# Patient Record
Sex: Female | Born: 1960
Health system: Southern US, Community
[De-identification: ages and names within clinical notes are randomized; demographics above are authoritative.]

## PROBLEM LIST (undated history)

## (undated) DIAGNOSIS — F329 Major depressive disorder, single episode, unspecified: Secondary | ICD-10-CM

## (undated) DIAGNOSIS — R0609 Other forms of dyspnea: Secondary | ICD-10-CM

## (undated) DIAGNOSIS — E8801 Alpha-1-antitrypsin deficiency: Secondary | ICD-10-CM

## (undated) DIAGNOSIS — R0600 Dyspnea, unspecified: Secondary | ICD-10-CM

## (undated) DIAGNOSIS — I10 Essential (primary) hypertension: Secondary | ICD-10-CM

## (undated) DIAGNOSIS — R002 Palpitations: Secondary | ICD-10-CM

## (undated) DIAGNOSIS — I517 Cardiomegaly: Secondary | ICD-10-CM

## (undated) DIAGNOSIS — F32A Depression, unspecified: Secondary | ICD-10-CM

## (undated) HISTORY — DX: Major depressive disorder, single episode, unspecified: F32.9

## (undated) HISTORY — DX: Essential (primary) hypertension: I10

## (undated) HISTORY — PX: COSMETIC SURGERY: SHX468

## (undated) HISTORY — DX: Depression, unspecified: F32.A

## (undated) HISTORY — DX: Dyspnea, unspecified: R06.00

## (undated) HISTORY — DX: Other forms of dyspnea: R06.09

## (undated) HISTORY — PX: APPENDECTOMY: SHX54

## (undated) HISTORY — DX: Alpha-1-antitrypsin deficiency: E88.01

## (undated) HISTORY — DX: Palpitations: R00.2

## (undated) HISTORY — PX: BREAST ENHANCEMENT SURGERY: SHX7

## (undated) HISTORY — PX: CHOLECYSTECTOMY: SHX55

---

## 1998-11-03 ENCOUNTER — Ambulatory Visit (HOSPITAL_COMMUNITY): Admission: RE | Admit: 1998-11-03 | Discharge: 1998-11-04 | Payer: Self-pay | Admitting: General Surgery

## 1998-11-03 ENCOUNTER — Encounter: Payer: Self-pay | Admitting: General Surgery

## 1999-01-06 ENCOUNTER — Other Ambulatory Visit: Admission: RE | Admit: 1999-01-06 | Discharge: 1999-01-06 | Payer: Self-pay | Admitting: Obstetrics and Gynecology

## 2000-02-23 ENCOUNTER — Other Ambulatory Visit: Admission: RE | Admit: 2000-02-23 | Discharge: 2000-02-23 | Payer: Self-pay | Admitting: *Deleted

## 2000-12-19 ENCOUNTER — Encounter (INDEPENDENT_AMBULATORY_CARE_PROVIDER_SITE_OTHER): Payer: Self-pay

## 2000-12-19 ENCOUNTER — Ambulatory Visit (HOSPITAL_COMMUNITY): Admission: RE | Admit: 2000-12-19 | Discharge: 2000-12-19 | Payer: Self-pay | Admitting: Obstetrics and Gynecology

## 2001-04-14 ENCOUNTER — Emergency Department (HOSPITAL_COMMUNITY): Admission: EM | Admit: 2001-04-14 | Discharge: 2001-04-15 | Payer: Self-pay | Admitting: Emergency Medicine

## 2001-04-15 ENCOUNTER — Encounter: Payer: Self-pay | Admitting: Emergency Medicine

## 2001-07-08 ENCOUNTER — Other Ambulatory Visit: Admission: RE | Admit: 2001-07-08 | Discharge: 2001-07-08 | Payer: Self-pay | Admitting: Obstetrics and Gynecology

## 2001-12-03 ENCOUNTER — Emergency Department (HOSPITAL_COMMUNITY): Admission: EM | Admit: 2001-12-03 | Discharge: 2001-12-03 | Payer: Self-pay

## 2002-02-25 ENCOUNTER — Inpatient Hospital Stay (HOSPITAL_COMMUNITY): Admission: EM | Admit: 2002-02-25 | Discharge: 2002-02-27 | Payer: Self-pay | Admitting: Psychiatry

## 2002-05-18 ENCOUNTER — Inpatient Hospital Stay (HOSPITAL_COMMUNITY): Admission: EM | Admit: 2002-05-18 | Discharge: 2002-05-22 | Payer: Self-pay | Admitting: Psychiatry

## 2002-05-27 ENCOUNTER — Other Ambulatory Visit (HOSPITAL_COMMUNITY): Admission: RE | Admit: 2002-05-27 | Discharge: 2002-05-29 | Payer: Self-pay | Admitting: Psychiatry

## 2002-06-08 ENCOUNTER — Encounter: Admission: RE | Admit: 2002-06-08 | Discharge: 2002-06-08 | Payer: Self-pay | Admitting: Psychiatry

## 2002-06-18 ENCOUNTER — Encounter: Admission: RE | Admit: 2002-06-18 | Discharge: 2002-06-18 | Payer: Self-pay | Admitting: Psychiatry

## 2002-06-23 ENCOUNTER — Encounter: Admission: RE | Admit: 2002-06-23 | Discharge: 2002-06-23 | Payer: Self-pay | Admitting: Psychiatry

## 2002-06-29 ENCOUNTER — Encounter: Admission: RE | Admit: 2002-06-29 | Discharge: 2002-06-29 | Payer: Self-pay | Admitting: Psychiatry

## 2002-07-24 ENCOUNTER — Ambulatory Visit: Admission: RE | Admit: 2002-07-24 | Discharge: 2002-07-24 | Payer: Self-pay | Admitting: Psychiatry

## 2002-08-20 ENCOUNTER — Encounter: Admission: RE | Admit: 2002-08-20 | Discharge: 2002-08-20 | Payer: Self-pay | Admitting: Professional Counselor

## 2002-08-26 ENCOUNTER — Encounter: Admission: RE | Admit: 2002-08-26 | Discharge: 2002-08-26 | Payer: Self-pay | Admitting: Psychiatry

## 2002-09-03 ENCOUNTER — Encounter: Admission: RE | Admit: 2002-09-03 | Discharge: 2002-09-03 | Payer: Self-pay | Admitting: Professional Counselor

## 2002-09-23 ENCOUNTER — Other Ambulatory Visit: Admission: RE | Admit: 2002-09-23 | Discharge: 2002-09-23 | Payer: Self-pay | Admitting: Obstetrics and Gynecology

## 2002-10-19 ENCOUNTER — Encounter: Admission: RE | Admit: 2002-10-19 | Discharge: 2002-10-19 | Payer: Self-pay | Admitting: Psychiatry

## 2002-10-28 ENCOUNTER — Encounter: Admission: RE | Admit: 2002-10-28 | Discharge: 2002-10-28 | Payer: Self-pay | Admitting: Psychiatry

## 2002-12-28 ENCOUNTER — Encounter: Admission: RE | Admit: 2002-12-28 | Discharge: 2002-12-28 | Payer: Self-pay | Admitting: Psychiatry

## 2003-01-04 ENCOUNTER — Encounter: Admission: RE | Admit: 2003-01-04 | Discharge: 2003-01-04 | Payer: Self-pay | Admitting: *Deleted

## 2003-01-19 ENCOUNTER — Encounter: Admission: RE | Admit: 2003-01-19 | Discharge: 2003-01-19 | Payer: Self-pay | Admitting: Psychiatry

## 2003-04-14 ENCOUNTER — Encounter: Admission: RE | Admit: 2003-04-14 | Discharge: 2003-04-14 | Payer: Self-pay | Admitting: Psychiatry

## 2003-04-28 ENCOUNTER — Encounter: Admission: RE | Admit: 2003-04-28 | Discharge: 2003-04-28 | Payer: Self-pay | Admitting: Psychiatry

## 2003-09-06 ENCOUNTER — Encounter: Admission: RE | Admit: 2003-09-06 | Discharge: 2003-09-06 | Payer: Self-pay | Admitting: Psychiatry

## 2005-09-30 ENCOUNTER — Emergency Department (HOSPITAL_COMMUNITY): Admission: AD | Admit: 2005-09-30 | Discharge: 2005-09-30 | Payer: Self-pay | Admitting: Family Medicine

## 2007-05-02 ENCOUNTER — Encounter: Admission: RE | Admit: 2007-05-02 | Discharge: 2007-05-02 | Payer: Self-pay | Admitting: Gastroenterology

## 2008-05-20 ENCOUNTER — Encounter: Admission: RE | Admit: 2008-05-20 | Discharge: 2008-05-20 | Payer: Self-pay | Admitting: Obstetrics and Gynecology

## 2008-06-08 ENCOUNTER — Encounter: Admission: RE | Admit: 2008-06-08 | Discharge: 2008-06-08 | Payer: Self-pay | Admitting: Obstetrics and Gynecology

## 2010-09-03 ENCOUNTER — Encounter: Payer: Self-pay | Admitting: Obstetrics and Gynecology
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
## 2010-12-29 NOTE — Discharge Summary (Signed)
NAME:  [name], [name]                       ACCOUNT NO.:  [id number]   MEDICAL RECORD NO.:  [id number]                   PATIENT TYPE:  IPS   LOCATION:  0503                                 [facility]ACILITY:  [facility]   PHYSICIAN:  Geoffery Lyons, MD                     DATE OF BIRTH:  1961/08/10   DATE OF ADMISSION:  02/24/2002  DATE OF DISCHARGE:  02/27/2002                                 DISCHARGE SUMMARY   CHIEF COMPLAINT AND PRESENTING ILLNESS:  This was the first admission to  [facility]  for this 50 year old divorced white female,  voluntarily admitted.  History of depression with an intentional overdose of  Flexeril, Klonopin.  She was home alone, wanting to go to sleep, unaware  that she did write a note to her family and her sons.  She went to her  primary doc for medication changes due to increased depression.  Wellbutrin  was increased to 400 mg per day.  Stressors include working night shift with  erratic sleep.  He did get to sleep all day the day his brother came to  check, then took more pills when he was there.   PAST PSYCHIATRIC HISTORY:  No outpatient treatment.   ALCOHOL AND DRUG HISTORY:  Four beers, denies any problem.   PAST MEDICAL HISTORY:  Noncontributory.   MEDICATIONS:  Wellbutrin 200 twice a day, Paxil 40 mg in the morning,  although she has been off for a couple of days.   PHYSICAL EXAMINATION:  Performed, failed to show any acute findings.   LABORATORY DATA:  CBC was within normal limits.  Thyroid profile was within  normal limits.  Urine pregnancy test was negative.   ADMITTING DIAGNOSES:   AXIS I:  Major depression.   AXIS II:  No diagnosis.   AXIS III:  No diagnosis.   AXIS IV:  Moderate.   AXIS V:  Global assessment of function upon admission 25-30, highest global  assessment of function in past year 70.   COURSE IN HOSPITAL:  She was admitted and started on intensive individual  and group psychotherapy.  She was placed back  on Wellbutrin.  The Paxil was  eventually decreased and discontinued and we started Lexapro as she felt  that the Paxil was not really working for her anymore.  Started trying to  understand what happened to her.  She felt that she was not being compliant  with the Wellbutrin and when she saw her primary physician it was increased  to 400 mg per day, a change that had something to do with the way she was  feeling.  She had been on the Paxil 40, which she felt was not working.  We  had a family session July 17 with sister and brother.  Denied any suicidal  intent.  In individual and group therapy she worked on Pharmacologist and  stress  management.  July 18, she was in full contact with reality, mood  improved, affect brighter.  She denied any suicidal or homicidal ideas.  The  session with the brother had gone well, said she felt supported and feels  willing and motivated to pursue further stabilization through the IOP  program.   DISCHARGE DIAGNOSES:   AXIS I:  Major depression   AXIS II:  No diagnosis.   AXIS III:  No diagnosis.   AXIS IV:  Moderate.   AXIS V:  Global assessment of function upon discharge 60.   DISCHARGE MEDICATIONS:  1. Wellbutrin SR 150 twice a day.  2. Lexapro 10 mg daily.  3. Ambien 10 mg at bedtime for sleep.  4. Ativan 0.5 mg every 6 hours as needed for anxiety.   DISPOSITION:  Follow up at IOP and Dr. Aleatha Borer.                                               Geoffery Lyons, MD    IL/MEDQ  D:  04/01/2002  T:  04/03/2002  Job:  [id number] [id number]

## 2010-12-29 NOTE — Op Note (Signed)
[facility]  Patient:    [name], [name]                    MRN: [id number] Proc. Date: 12/19/00 Adm. Date:  [id number] Attending:  Silverio Lay A                           Operative Report  PREOPERATIVE DIAGNOSIS:       Submucosal fibroid.  POSTOPERATIVE DIAGNOSIS:      Submucosal fibroid.  OPERATION:                    Hysteroscopy with resection of fibroid or                               myomectomy.  SURGEON:                      Silverio Lay, M.D.  ANESTHESIA:                   General.  ESTIMATED BLOOD LOSS:         Minimal.  DESCRIPTION OF PROCEDURE:     After being informed of the planned procedure with possible complications including bleeding, infection, uterine perforation, need for laparoscopy or laparotomy with possibility of bowel injury, the patient was fully consented and taken to OR #2.  She was given general anesthesia with laryngeal mask.  She was placed in the lithotomy position, prepped and draped in a sterile fashion.  The bladder was emptied with a Foley catheter.  GYN exam revealed a slightly retroverted uterus, normal shape and size, two normal adnexa.  A weighted speculum was inserted; anterior of the cervix was grasped with the tenaculum forceps, and uterus was sounded at 8 cm.  We proceed with systematic dilatation of the cervix with Hegar dilators at #33 in an easy fashion.  Hysteroscope was then inserted in the uterine cavity.  Perfusion was started is sorbitol 3% at a maximum pressure of 90 mmHg which allowed [place] good visualization of the uterine cavity.  We could easily see the right tubal ostium which was normal.  The left side was obstructed by a left anterolateral wall fibroid with a large base measuring approximately 2.5 cm.  Endometrium was thin and appeared normal.  We then proceed with systematic resection of this fibroid in multiple passes using a double loop resectoscope.  We were able to remove  the whole fibroid up to the uterine wall and to finally visualize the left tubal ostia which was also normal.  We then reduced intrauterine pressure to 50 mmHg, and two small bleeding sites were coagulated.  Hemostasis after that was felt to be adequate.  Instruments were then removed.  Instrument and sponge count is complete x 2.  Estimated blood loss is minimal.  Fluid deficit is -150 cc. The procedure was very well tolerated by the patient who was taken to the recovery room in a well and stable condition. DD:  12/19/00 TD:  12/19/00 Job: [id number]

## 2011-11-26 ENCOUNTER — Ambulatory Visit (INDEPENDENT_AMBULATORY_CARE_PROVIDER_SITE_OTHER): Payer: BC Managed Care – PPO | Admitting: Internal Medicine

## 2011-11-26 VITALS — BP 147/81 | HR 93 | Temp 97.4°F | Resp 16 | Ht 65.75 in | Wt 124.6 lb

## 2011-11-26 DIAGNOSIS — S40862A Insect bite (nonvenomous) of left upper arm, initial encounter: Secondary | ICD-10-CM

## 2011-11-26 DIAGNOSIS — IMO0001 Reserved for inherently not codable concepts without codable children: Secondary | ICD-10-CM

## 2011-11-26 DIAGNOSIS — F17200 Nicotine dependence, unspecified, uncomplicated: Secondary | ICD-10-CM

## 2011-11-26 MED ORDER — MUPIROCIN CALCIUM 2 % EX CREA: TOPICAL_CREAM | Freq: Two times a day (BID) | CUTANEOUS | Status: AC

## 2011-11-26 NOTE — Progress Notes (Signed)
  Subjective:    Patient ID: [name], female    DOB: Mar 30, 1961, 51 y.o.   MRN: [id number]  HPI  [name] is a 51 year old WF here with complaints of a insect bite to her left wrist on Saturday. It has come up to a small white papule on her arm surrounded by erythema.  Itches slightly, she denies any fever, chills, myalgias, headache, or other systemic symptoms..  She is generally very healthy, on no prescription meds, sees [name] every year for her GYN check.  Trying to reduce her cigarettes, "smokes rarely".   Review of Systems  All other systems reviewed and are negative.       Objective:   Physical Exam  Vitals reviewed. Constitutional: She is oriented to person, place, and time. She appears well-developed and well-nourished.  HENT:  Head: Normocephalic.  Eyes: Conjunctivae are normal.  Cardiovascular: Normal rate, regular rhythm and normal heart sounds.   Pulmonary/Chest: Effort normal and breath sounds normal.  Musculoskeletal: She exhibits no tenderness.  Neurological: She is alert and oriented to person, place, and time.  Skin: There is erythema.       2 mm white papule on left wrist with slight surrounding erythema, no adenopathy in her left arm appreciated.          Assessment & Plan:  Insect bite with superficial abcess/cellulitis only.  Suggest warm compresses, wash gently to unroof lesion at home then apply Bactroban cream to site twice daily until skin heals.  Pt agrees with this plan.  Encouraged to stop smoking!

## 2012-05-20 ENCOUNTER — Ambulatory Visit (INDEPENDENT_AMBULATORY_CARE_PROVIDER_SITE_OTHER): Payer: BC Managed Care – PPO | Admitting: Physician Assistant

## 2012-05-20 ENCOUNTER — Ambulatory Visit: Payer: BC Managed Care – PPO

## 2012-05-20 VITALS — BP 90/70 | HR 69 | Temp 98.3°F | Resp 17 | Ht 65.75 in | Wt 129.0 lb

## 2012-05-20 DIAGNOSIS — J40 Bronchitis, not specified as acute or chronic: Secondary | ICD-10-CM

## 2012-05-20 DIAGNOSIS — R05 Cough: Secondary | ICD-10-CM

## 2012-05-20 LAB — POCT CBC
Granulocyte percent: 53.8 %G (ref 37–80)
HCT, POC: 44.4 % (ref 37.7–47.9)
Hemoglobin: 14.1 g/dL (ref 12.2–16.2)
Lymph, poc: 2.3 (ref 0.6–3.4)
MCV: 89.6 fL (ref 80–97)
POC Granulocyte: 3.5 (ref 2–6.9)
POC LYMPH PERCENT: 35 %L (ref 10–50)
RDW, POC: 14.1 %

## 2012-05-20 MED ORDER — BENZONATATE 200 MG PO CAPS: 200.0000 mg | ORAL_CAPSULE | Freq: Two times a day (BID) | ORAL | Status: DC | PRN

## 2012-05-20 MED ORDER — AZITHROMYCIN 250 MG PO TABS: ORAL_TABLET | ORAL | Status: DC

## 2012-05-20 NOTE — Progress Notes (Signed)
Patient ID: [name] MRN: [id number], DOB: 10-17-1960, 51 y.o. Date of Encounter: 05/20/2012, 11:29 AM  Primary Physician: No primary provider on file.  Chief Complaint:  Chief Complaint  Patient presents with  . Back Pain  . Chills    x3days  . Sore Throat  . Cough    productive at night/greenish yellow    HPI: 51 y.o. year old female presents with a three day history of mild nasal congestion, post nasal drip, sore throat, myalgias, and cough. No sinus pressure. Subjective fever and chills. Cough is mildly productive of green/yellow sputum and seems to be worse at night keeping her awake. No wheezing, shortness of breath, or dyspnea. Normal hearing. Has tried OTC cold preps without success. No GI complaints. Appetite normal. Had pneumonia the previous year, and this feels similar. She does occasionally smoke tobacco when she drinks alcohol.   No sick contacts, recent antibiotics, or recent travels.   No leg trauma, sedentary periods, or h/o cancer.  No past medical history on file.   Home Meds: Prior to Admission medications   Not on File    Allergies:  Allergies  Allergen Reactions  . Codeine Nausea And Vomiting  . Penicillins Rash  . Sulfa Drugs Cross Reactors Rash    History   Social History  . Marital Status: Divorced    Spouse Name: N/A    Number of Children: N/A  . Years of Education: N/A   Occupational History  . Not on file.   Social History Main Topics  . Smoking status: Former Smoker    Quit date: 05/20/2004  . Smokeless tobacco: Not on file  . Alcohol Use: Not on file  . Drug Use: Not on file  . Sexually Active: Not on file   Other Topics Concern  . Not on file   Social History Narrative  . No narrative on file     Review of Systems: Constitutional: negative for night sweats or weight changes Cardiovascular: negative for chest pain or palpitations Respiratory: negative for hemoptysis, wheezing, or shortness of breath Abdominal:  negative for abdominal pain, nausea, vomiting or diarrhea Dermatological: negative for rash Neurologic: negative for headache   Physical Exam: Blood pressure 90/70, pulse 69, temperature 98.3 F (36.8 C), temperature source Oral, resp. rate 17, height 5' 5.75" (1.67 m), weight 129 lb (58.514 kg), SpO2 97.00%., Body mass index is 20.98 kg/(m^2). General: Well developed, well nourished, in no acute distress. Head: Normocephalic, atraumatic, eyes without discharge, sclera non-icteric, nares are congested. Bilateral auditory canals clear, TM's are without perforation, pearly grey with reflective cone of light bilaterally. No sinus TTP. Oral cavity moist, dentition normal. Posterior pharynx with post nasal drip and mild erythema. No peritonsillar abscess or tonsillar exudate. Neck: Supple. No thyromegaly. Full ROM. No lymphadenopathy. Lungs: Coarse breath sounds left lower lobe posteriorly without wheezes, rales, or rhonchi. Breathing is unlabored.  Heart: RRR with S1 S2. No murmurs, rubs, or gallops appreciated. Msk:  Strength and tone normal for age. Extremities: No clubbing or cyanosis. No edema. Neuro: Alert and oriented X 3. Moves all extremities spontaneously. CNII-XII grossly in tact. Psych:  Responds to questions appropriately with a normal affect.   Labs: Results for orders placed in visit on 05/20/12  POCT CBC      Component Value Range   WBC 6.5  4.6 - 10.2 K/uL   Lymph, poc 2.3  0.6 - 3.4   POC LYMPH PERCENT 35.0  10 - 50 %L   MID (  cbc) 0.7  0 - 0.9   POC MID % 11.2  0 - 12 %M   POC Granulocyte 3.5  2 - 6.9   Granulocyte percent 53.8  37 - 80 %G   RBC 4.96  4.04 - 5.48 M/uL   Hemoglobin 14.1  12.2 - 16.2 g/dL   HCT, POC 40.9  81.1 - 47.9 %   MCV 89.6  80 - 97 fL   MCH, POC 28.4  27 - 31.2 pg   MCHC 31.8  31.8 - 35.4 g/dL   RDW, POC 91.4     Platelet Count, POC 240  142 - 424 K/uL   MPV 9.4  0 - 99.8 fL    [facility] reading (PRIMARY) by  Dr. Perrin Maltese. Bilateral breast  augmentation Hyperinflation  No infiltrate   ASSESSMENT AND PLAN:  51 y.o. year old female with bronchitis and cough. -Azithromycin 250 MG #6 2 po first day then 1 po next 4 days no RF -Tessalon Perles 200 mg 1 po tid prn cough #40 no RF -Reaction to "pain medication" is hives -Declines inhaler -Advised patient that she must quit smoking tobacco -Advised her that her chest xray shows hyperinflation and explained what this means -Mucinex -Tylenol/Motrin prn -Rest/fluids -Out of work today -RTC precautions -RTC 3-5 days if no improvement  Signed, Eula Listen, PA-C 05/20/2012 11:29 AM

## 2013-04-24 ENCOUNTER — Other Ambulatory Visit: Payer: Self-pay

## 2013-04-24 DIAGNOSIS — Z9882 Breast implant status: Secondary | ICD-10-CM

## 2013-04-24 DIAGNOSIS — Z1231 Encounter for screening mammogram for malignant neoplasm of breast: Secondary | ICD-10-CM

## 2013-04-28 ENCOUNTER — Ambulatory Visit
Admission: RE | Admit: 2013-04-28 | Discharge: 2013-04-28 | Disposition: A | Discharge status: Home / Self Care | Payer: BC Managed Care – PPO | Source: Ambulatory Visit

## 2013-04-28 DIAGNOSIS — Z1231 Encounter for screening mammogram for malignant neoplasm of breast: Secondary | ICD-10-CM

## 2013-04-28 DIAGNOSIS — Z9882 Breast implant status: Secondary | ICD-10-CM

## 2013-08-26 ENCOUNTER — Ambulatory Visit (INDEPENDENT_AMBULATORY_CARE_PROVIDER_SITE_OTHER): Payer: BC Managed Care – PPO | Admitting: Family Medicine

## 2013-08-26 ENCOUNTER — Ambulatory Visit: Payer: BC Managed Care – PPO

## 2013-08-26 VITALS — BP 120/70 | HR 81 | Temp 98.0°F | Resp 16 | Ht 65.0 in | Wt 125.0 lb

## 2013-08-26 DIAGNOSIS — M545 Low back pain, unspecified: Secondary | ICD-10-CM

## 2013-08-26 DIAGNOSIS — M25552 Pain in left hip: Secondary | ICD-10-CM

## 2013-08-26 DIAGNOSIS — S300XXA Contusion of lower back and pelvis, initial encounter: Secondary | ICD-10-CM

## 2013-08-26 DIAGNOSIS — S7002XA Contusion of left hip, initial encounter: Secondary | ICD-10-CM

## 2013-08-26 DIAGNOSIS — S20229A Contusion of unspecified back wall of thorax, initial encounter: Secondary | ICD-10-CM

## 2013-08-26 DIAGNOSIS — S7000XA Contusion of unspecified hip, initial encounter: Secondary | ICD-10-CM

## 2013-08-26 MED ORDER — METHOCARBAMOL 500 MG PO TABS: 500.0000 mg | ORAL_TABLET | Freq: Four times a day (QID) | ORAL | Status: DC | PRN

## 2013-08-26 NOTE — Progress Notes (Addendum)
Subjective:    Patient ID: [name], female    DOB: September 07, 1960, 53 y.o.   MRN: [id number]  HPI This chart was scribed for Kristi Smith-MD, by Ladona Ridgel [place] Inthavong, Scribe. This patient was seen in room 3 and the patient's care was started at 10:50 AM.  HPI Comments: [name] is a 53 y.o. female who presents to the [facility] complaining of a fall this AM b/c she tripped while walking off of her porch landing on left elbow, right wrist and left lower back w/superficial abrasion to her left lower back. She tried using a heating pad and took x3 ibuprofen w/out any relief from her back pain. She denies hitting her head. She reports pain radiates down left leg; she denies numbness/weakness of either legs. She denies any urinary incontinence or difficulty urinating.  Job is very physically demanding; does a lot of lifting and walking at work.   She has no PCP   There are no active problems to display for this patient.  Past Surgical History  Procedure Laterality Date  . Appendectomy    . Cosmetic surgery    . Cesarean section     Family History  Problem Relation Age of Onset  . Heart disease Mother   . Stroke Mother   . Heart disease Father   . Healthy Sister   . Healthy Brother   . Healthy Brother   . Healthy Sister    History   Social History  . Marital Status: Divorced    Spouse Name: N/A    Number of Children: N/A  . Years of Education: N/A   Occupational History  . Not on file.   Social History Main Topics  . Smoking status: Former Smoker    Quit date: 05/20/2004  . Smokeless tobacco: Not on file  . Alcohol Use: Not on file  . Drug Use: Not on file  . Sexual Activity: Not on file   Other Topics Concern  . Not on file   Social History Narrative  . No narrative on file    Allergies  Allergen Reactions  . Codeine Nausea And Vomiting  . Penicillins Rash  . Sulfa Drugs Cross Reactors Rash     Review of Systems  Constitutional:  Negative for fever and chills.  Respiratory: Negative for cough and shortness of breath.   Cardiovascular: Negative for chest pain.  Gastrointestinal: Negative for abdominal pain.  Genitourinary: Negative for decreased urine volume.  Musculoskeletal: Positive for arthralgias, back pain and myalgias. Negative for gait problem, joint swelling, neck pain and neck stiffness.  Skin: Positive for color change. Negative for wound.  Neurological: Positive for headaches. Negative for dizziness, syncope, facial asymmetry, weakness, light-headedness and numbness.      Objective:   Physical Exam  Nursing note and vitals reviewed. Constitutional: She is oriented to person, place, and time. She appears well-developed and well-nourished. No distress.  HENT:  Head: Normocephalic and atraumatic.  Eyes: Conjunctivae and EOM are normal. Right eye exhibits no discharge. Left eye exhibits no discharge.  Neck: Normal range of motion. Neck supple.  Cardiovascular: Normal rate.   Pulmonary/Chest: Effort normal. No respiratory distress.  Musculoskeletal: Normal range of motion. She exhibits tenderness. She exhibits no edema.       Right shoulder: Normal.       Left shoulder: Normal.       Right elbow: Normal.      Left elbow: Normal.       Right wrist:  Normal.       Left wrist: Normal.       Right hip: Normal.       Right knee: Normal.       Left knee: Normal.       Right ankle: Normal.       Left ankle: Normal.       Cervical back: Normal.       Thoracic back: Normal.       Right foot: Normal.       Left foot: Normal.  No pain w/rotation of her neck Superficial abrasion to superior aspect of gluteal fold  Tenderness to palpation sacral and coccyx region Tenderness to palpation left lumbar paraspinal region Left hip pain w/external rotation but left hip non tender to palpation. Mild tenderness to palpation left iliac crest.   Full ROM cervical spine w/out pain and full ROM w/out pain to her  BUE  Marching intact, hell toe intact Motor 5/5  Neurological: She is alert and oriented to person, place, and time.  SLR positive bilaterally   Skin: Skin is warm and dry.  Psychiatric: She has a normal mood and affect. Thought content normal.   Triage Vitals: BP 120/70  Pulse 81  Temp(Src) 98 F (36.7 C) (Oral)  Resp 16  Ht 5\' 5"  (1.651 m)  Wt 125 lb (56.7 kg)  BMI 20.80 kg/m2  SpO2 99%  DIAGNOSTIC STUDIES: Oxygen Saturation is 99% on room air, normal by my interpretation.    COORDINATION OF CARE: At 1010 AM Discussed treatment plan with patient which includes lumbar spine X-ray and coccyx X-ray. Patient agrees.   [facility] reading (PRIMARY) by  Dr. .  LUMBAR SPINE:  NAD; sacralization of L5.  L HIP: NAD.      Assessment & Plan:  Lower back pain - Plan: DG Lumbar Spine Complete  Left hip pain - Plan: DG Hip Complete Left  Contusion of lower back  Contusion of hip, left  1.  Pain L hip/contusion L hip:  New.  Continue Ibuprofen 600-800mg  tid with food; rx for Robaxin qid PRN.  Continue heat to area bid for 15 minutes; encourage frequent ambulation and stretching.  2.  Low back pain/contusion lower back:  New.  Secondary to fall and trauma.  Continue Ibuprofen 600-800mg  tid PRN; rx for Robaxin provided; continue heat and stretching and frequent ambulation.  Call in two weeks if no improvement.  Avoid heavy lifting when possible yet job very physically demanding which may interfere with improvement.  Meds ordered this encounter  Medications  . methocarbamol (ROBAXIN) 500 MG tablet    Sig: Take 1 tablet (500 mg total) by mouth every 6 (six) hours as needed for muscle spasms.    Dispense:  40 tablet    Refill:  0    I personally performed the services described in this documentation, which was scribed in my presence.  The recorded information has been reviewed and is accurate.  Katrinka Blazing, M.D.  [facility] [address] [place], [place]  [place] [phone] phone [phone] fax

## 2013-08-28 ENCOUNTER — Telehealth: Payer: Self-pay

## 2013-08-28 MED ORDER — TRAMADOL HCL 50 MG PO TABS: 50.0000 mg | ORAL_TABLET | Freq: Four times a day (QID) | ORAL | Status: DC | PRN

## 2013-08-28 NOTE — Telephone Encounter (Signed)
OK to extend work note for 1/17 and 1/18 for return to work on 1/19.  I am happy to call in Tramadol for her for pain that she can take in addition to Ibuprofen and muscle relaxer.  Please call in Tramadol as approved by rx if patient desires.

## 2013-08-28 NOTE — Telephone Encounter (Signed)
Dr. Katrinka Blazing:  Patient saw you Wednesday morning due to a fall on ice.  She is still hurting and doesn't think she can work this weekend and wants you to write a note so she can be out of work this weekend and go back to work on Monday.  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  Her number is [phone].   So much pulling and lifting and it is really bothering her.  She said even her insides are hurting. She has been been taking the muscle relaxers that you prescribed and Ibuprofen and she doesn't feel like it is touching the pain.  Should she take something else or just keep doing what she's doing. Please leave a message on her cell phone.  She is planning on leaving early today due to the pain and would like to know asap if we can get her a note.

## 2013-08-28 NOTE — Telephone Encounter (Signed)
LMOM for pt that extended OOW note ready and that I called in Tramadol Rx to [facility] if she feels that she needs it, she may p/up.

## 2013-08-28 NOTE — Telephone Encounter (Signed)
Is it ok for note? 

## 2013-09-14 ENCOUNTER — Telehealth: Payer: Self-pay

## 2013-09-14 NOTE — Telephone Encounter (Signed)
Pt saw Dr. Katrinka Blazing on 1/14 for back pain. She has tried to take the muscle relaxer but it did not help and the pain medicine makes her sick on her stomach and makes her have trouble breathing. She would like to know if there is anything else that we could call in for her. She was going to look up the name of a medication that she has taken in the past that helped her.  She can be reached at [phone] She uses the [facility]

## 2013-09-16 NOTE — Telephone Encounter (Signed)
Call --- 1.  Most pain medications can cause nausea unfortunately.  2. Does she recall the name of the medication that she could tolerate in the past?  If not, recommend taking Tylenol ES 2 tablets four times daily for pain as needed.  She can also take Ibuprofen with the Tylenol because they have different mechanisms of action.

## 2013-09-16 NOTE — Telephone Encounter (Signed)
Pt says that she has been able to take hydrocodone in the past. She's had back trouble for years but since injuring her back its been worse. States she has already been taking ibuprofen and tylenol and that doesn't touch her pain. 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 Her preference is to try pain meds and give her back a little more time to heal. If this doesn't work then she will RTC.

## 2013-09-16 NOTE — Telephone Encounter (Signed)
LMOM for pt to call back.

## 2014-11-17 ENCOUNTER — Ambulatory Visit (INDEPENDENT_AMBULATORY_CARE_PROVIDER_SITE_OTHER): Payer: BLUE CROSS/BLUE SHIELD | Admitting: Physician Assistant

## 2014-11-17 VITALS — BP 142/94 | HR 79 | Temp 98.3°F | Resp 18 | Ht 65.5 in | Wt 128.0 lb

## 2014-11-17 DIAGNOSIS — R079 Chest pain, unspecified: Secondary | ICD-10-CM

## 2014-11-17 DIAGNOSIS — G44209 Tension-type headache, unspecified, not intractable: Secondary | ICD-10-CM

## 2014-11-17 DIAGNOSIS — M549 Dorsalgia, unspecified: Secondary | ICD-10-CM | POA: Diagnosis not present

## 2014-11-17 DIAGNOSIS — R350 Frequency of micturition: Secondary | ICD-10-CM | POA: Diagnosis not present

## 2014-11-17 DIAGNOSIS — R0609 Other forms of dyspnea: Secondary | ICD-10-CM

## 2014-11-17 LAB — POCT UA - MICROSCOPIC ONLY
BACTERIA, U MICROSCOPIC: NEGATIVE
CASTS, UR, LPF, POC: NEGATIVE
Crystals, Ur, HPF, POC: NEGATIVE
MUCUS UA: NEGATIVE
YEAST UA: NEGATIVE

## 2014-11-17 LAB — POCT URINALYSIS DIPSTICK
Bilirubin, UA: NEGATIVE
Glucose, UA: NEGATIVE
KETONES UA: NEGATIVE
LEUKOCYTES UA: NEGATIVE
NITRITE UA: NEGATIVE
PROTEIN UA: NEGATIVE
Spec Grav, UA: 1.01
UROBILINOGEN UA: 0.2
pH, UA: 7

## 2014-11-17 MED ORDER — CYCLOBENZAPRINE HCL 5 MG PO TABS: 5.0000 mg | ORAL_TABLET | Freq: Three times a day (TID) | ORAL | Status: DC | PRN

## 2014-11-17 NOTE — Patient Instructions (Signed)
I think your headache is most likely a tension type headache or from some congestion from allergies. Please take a daily antihistamine like Claritin once day for allergy season.  Please come back to see [place] if your headache persists.  I think your back pain is most likely due to strain, spasm in mid back. Please take the flexeril at night. Please bend at the knees when lifting, think about a massage, and apply heat when you can. Please come back to see [place] if these measures don't help for further workup, possibly imaging.  Your urine sample was normal today.  Your chest pressure and shortness of breath is concerning. Please let us know if you change your mind about a cardiology referral. Please go to the ER asap if the chest pain or shortness of breath worsens or persists.

## 2014-11-17 NOTE — Progress Notes (Signed)
Subjective:    Patient ID: [name], female    DOB: Jun 25, 1961, 54 y.o.   MRN: [id number]  Chief Complaint  Patient presents with  . Back Pain    mid back, worse the past few mths   . Headache    over a mth now worse when waking up    There are no active problems to display for this patient.  Prior to Admission medications   Not on File   Medications, allergies, past medical history, surgical history, family history, social history and problem list reviewed and updated.  HPI  54 yof with no significant pmh presents with back pain, urinary freq, headache, chest pain, shortness of breath.   Back pain - Hx bilateral mid back pain past few months. Has been ongoing and fairly constant, approx 8/10. Denies trauma. 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 Denies radiation of pain. Denies sciatica. Denies bowel/bladder dysfx (though she has had mild increased urinary freq past 2 wks) and denies perianal los. No hematuria, dysuria.   HA - Intermittent past 2 months. Bitemporal. Lasts for hrs at a time. Sometimes worst in the morning. Not worst of life. No assoc fevers. No neck pain. No assoc vision changes. No assoc syncope. No extremity weakness, numbness. Denies itchy watery eyes, head congestion, otalgia.   DOE - mentions DOE past few wks. Getting sob with working in yard, going up stairs. No sob at rest. This is new for her. Denies orthopnea, LE edema, PND. Denies assoc chest pain, denies exertional cp.   Chest pain - no exertional cp but does sometimes feel pressure in chest when lifting heavy things. Resolves within secs of activity. No radiation. Rated 5/10. SSCP. No assoc palps, presyncope, syncope. Not sob with cp episodes. Has strong fam hx cad.   States she is not concerned about the cp and sob, only worried about the back pain which is severe and the HA.   Review of Systems See HPI.     Objective:   Physical Exam  Constitutional: She is  oriented to person, place, and time. She appears well-developed and well-nourished.  Non-toxic appearance. She does not have a sickly appearance. She does not appear ill. No distress.  BP 142/94 mmHg  Pulse 79  Temp(Src) 98.3 F (36.8 C) (Oral)  Resp 18  Ht 5' 5.5" (1.664 m)  Wt 128 lb (58.06 kg)  BMI 20.97 kg/m2  SpO2 100%   HENT:  Right Ear: Tympanic membrane normal.  Left Ear: Tympanic membrane normal.  Nose: No mucosal edema or rhinorrhea. Right sinus exhibits no maxillary sinus tenderness and no frontal sinus tenderness. Left sinus exhibits no maxillary sinus tenderness and no frontal sinus tenderness.  Mouth/Throat: Uvula is midline, oropharynx is clear and moist and mucous membranes are normal.  Eyes: Conjunctivae and EOM are normal. Pupils are equal, round, and reactive to light.  Neck: No JVD present. No Brudzinski's sign noted.  Cardiovascular: Normal rate, regular rhythm and normal heart sounds.  Exam reveals no gallop.   No murmur heard. Pulmonary/Chest: Effort normal and breath sounds normal. No tachypnea. She has no decreased breath sounds. She has no wheezes. She has no rhonchi. She has no rales.  Musculoskeletal:       Cervical back: She exhibits no bony tenderness.       Thoracic back: She exhibits tenderness, pain and spasm. She exhibits normal range of motion and no bony tenderness.  Lumbar back: She exhibits no tenderness, no bony tenderness and no spasm.       Back:  TTP across mid back. No spinal ttp. Mild spasm noted   Lymphadenopathy:       Head (right side): No submental, no submandibular and no tonsillar adenopathy present.       Head (left side): No submental, no submandibular and no tonsillar adenopathy present.    She has no cervical adenopathy.  Neurological: She is alert and oriented to person, place, and time. She has normal strength. No cranial nerve deficit or sensory deficit. She displays a negative Romberg sign. Coordination and gait normal.    Psychiatric: She has a normal mood and affect. Her speech is normal and behavior is normal.   Results for orders placed or performed in visit on 11/17/14  POCT urinalysis dipstick  Result Value Ref Range   Color, UA yellow    Clarity, UA clear    Glucose, UA neg    Bilirubin, UA neg    Ketones, UA neg    Spec Grav, UA 1.010    Blood, UA trace-lysed    pH, UA 7.0    Protein, UA neg    Urobilinogen, UA 0.2    Nitrite, UA neg    Leukocytes, UA Negative   POCT UA - Microscopic Only  Result Value Ref Range   WBC, Ur, HPF, POC 0-1    RBC, urine, microscopic 0-1    Bacteria, U Microscopic neg    Mucus, UA neg    Epithelial cells, urine per micros 0-2    Crystals, Ur, HPF, POC neg    Casts, Ur, LPF, POC neg    Yeast, UA neg       Assessment & Plan:   54 yof with no significant pmh presents with back pain, urinary freq, headache, chest pain, shortness of breath.   Increased frequency of urination - Plan: POCT urinalysis dipstick, POCT UA - Microscopic Only --no increased thirst --ua normal --no dehydration to suggest as cause of below headache --rtc if persists  Mid back pain - Plan: POCT urinalysis dipstick, POCT UA - Microscopic Only --no bony ttp, no xray indicated, no trauma --no cva tenderness --ttp, spasm paraspinals mid back bilaterally --heat, massage, flexeril, light rom, bend at knees with lifting --rtc for imaging if persists  Tension-type headache, not intractable, unspecified chronicity pattern --doubt sah with chronicity, doubt meningitis with no fevers, nuchal rigidity --suspect tension as bitemporal, lasts for hrs, comes on most days --denies snoring, waking sob --tylenol prn, hydration --rtc if continue --could be secondary to allergic rhinitis though no signs of this on exam today, rec daily antihistamine, flonase through allergy season  Dyspnea on exertion Chest pain, unspecified chest pain type --DOE concerning for cardiac cause  --Fam hx cad  otherwise no known risk factors --Discussed with pt concern for cardiac etiology, pt declines EKG, chest xray, stress test, cardiology referral --instructed to go to ER asap if cp worsens or does not resolve on own, or if shortness of breath worsens  Donnajean Lopes, PA-C Physician Assistant-Certified [facility]  11/17/2014 1:13 PM

## 2014-11-25 ENCOUNTER — Telehealth: Payer: Self-pay

## 2014-11-25 NOTE — Telephone Encounter (Signed)
Pt saw Donnajean Lopes, PA at 11/17/2014 12:33 PM for Mid back pain and given a muscle relaxer, pt states it is not working for her, and she would like to know if there is something else she can be called in. Please advise

## 2014-11-26 NOTE — Telephone Encounter (Signed)
As per the note from 11/17/14 she should return to care for possible imaging and further workup since the pain has persisted. She can always take tylenol as needed for the pain if the flexeril did not help.

## 2014-11-27 NOTE — Telephone Encounter (Signed)
Pt.notified

## 2015-09-16 ENCOUNTER — Emergency Department (HOSPITAL_COMMUNITY): Payer: BLUE CROSS/BLUE SHIELD

## 2015-09-16 ENCOUNTER — Encounter (HOSPITAL_COMMUNITY): Payer: Self-pay | Admitting: Family Medicine

## 2015-09-16 ENCOUNTER — Emergency Department (HOSPITAL_COMMUNITY)
Admission: EM | Admit: 2015-09-16 | Discharge: 2015-09-16 | Disposition: A | Discharge status: Home / Self Care | Payer: BLUE CROSS/BLUE SHIELD | Attending: Emergency Medicine | Admitting: Emergency Medicine

## 2015-09-16 DIAGNOSIS — R112 Nausea with vomiting, unspecified: Secondary | ICD-10-CM | POA: Diagnosis not present

## 2015-09-16 DIAGNOSIS — R630 Anorexia: Secondary | ICD-10-CM | POA: Insufficient documentation

## 2015-09-16 DIAGNOSIS — Z79899 Other long term (current) drug therapy: Secondary | ICD-10-CM | POA: Insufficient documentation

## 2015-09-16 DIAGNOSIS — F329 Major depressive disorder, single episode, unspecified: Secondary | ICD-10-CM | POA: Insufficient documentation

## 2015-09-16 DIAGNOSIS — Z880 Allergy status to penicillin: Secondary | ICD-10-CM | POA: Diagnosis not present

## 2015-09-16 DIAGNOSIS — R002 Palpitations: Secondary | ICD-10-CM | POA: Diagnosis not present

## 2015-09-16 DIAGNOSIS — Z87891 Personal history of nicotine dependence: Secondary | ICD-10-CM | POA: Insufficient documentation

## 2015-09-16 DIAGNOSIS — R519 Headache, unspecified: Secondary | ICD-10-CM

## 2015-09-16 DIAGNOSIS — I10 Essential (primary) hypertension: Secondary | ICD-10-CM | POA: Insufficient documentation

## 2015-09-16 DIAGNOSIS — R51 Headache: Secondary | ICD-10-CM | POA: Diagnosis present

## 2015-09-16 LAB — I-STAT TROPONIN, ED
TROPONIN I, POC: 0 ng/mL (ref 0.00–0.08)
Troponin i, poc: 0 ng/mL (ref 0.00–0.08)

## 2015-09-16 LAB — BASIC METABOLIC PANEL
ANION GAP: 10 (ref 5–15)
BUN: 7 mg/dL (ref 6–20)
CALCIUM: 9.5 mg/dL (ref 8.9–10.3)
CO2: 30 mmol/L (ref 22–32)
Chloride: 101 mmol/L (ref 101–111)
Creatinine, Ser: 0.8 mg/dL (ref 0.44–1.00)
GFR calc non Af Amer: >60 mL/min (ref >60)
Glucose, Bld: 101 mg/dL — ABNORMAL HIGH (ref 65–99)
Potassium: 3.4 mmol/L — ABNORMAL LOW (ref 3.5–5.1)
Sodium: 141 mmol/L (ref 135–145)

## 2015-09-16 LAB — CBC
HCT: 40.4 % (ref 36.0–46.0)
HEMOGLOBIN: 13.8 g/dL (ref 12.0–15.0)
MCH: 30.2 pg (ref 26.0–34.0)
MCHC: 34.2 g/dL (ref 30.0–36.0)
MCV: 88.4 fL (ref 78.0–100.0)
Platelets: 236 10*3/uL (ref 150–400)
RBC: 4.57 MIL/uL (ref 3.87–5.11)
RDW: 13.5 % (ref 11.5–15.5)
WBC: 5.4 10*3/uL (ref 4.0–10.5)

## 2015-09-16 LAB — TSH: TSH: 3.222 u[IU]/mL (ref 0.350–4.500)

## 2015-09-16 LAB — URINALYSIS, ROUTINE W REFLEX MICROSCOPIC
BILIRUBIN URINE: NEGATIVE
Glucose, UA: NEGATIVE mg/dL
HGB URINE DIPSTICK: NEGATIVE
KETONES UR: NEGATIVE mg/dL
LEUKOCYTES UA: NEGATIVE
NITRITE: NEGATIVE
Protein, ur: NEGATIVE mg/dL
SPECIFIC GRAVITY, URINE: 1.005 (ref 1.005–1.030)
pH: 7 (ref 5.0–8.0)

## 2015-09-16 LAB — D-DIMER, QUANTITATIVE (NOT AT ARMC): D DIMER QUANT: 0.3 ug{FEU}/mL (ref 0.00–0.50)

## 2015-09-16 MED ORDER — DEXAMETHASONE SODIUM PHOSPHATE 10 MG/ML IJ SOLN: 10.0000 mg | Freq: Once | INTRAMUSCULAR | Status: DC

## 2015-09-16 MED ORDER — DIPHENHYDRAMINE HCL 50 MG/ML IJ SOLN
50.0000 mg | Freq: Once | INTRAMUSCULAR | Status: DC
  Filled 2015-09-16: qty 1

## 2015-09-16 MED ORDER — DIPHENHYDRAMINE HCL 50 MG/ML IJ SOLN
25.0000 mg | Freq: Once | INTRAMUSCULAR | Status: AC
  Administered 2015-09-16: 25 mg via INTRAVENOUS

## 2015-09-16 MED ORDER — HYDROCHLOROTHIAZIDE 25 MG PO TABS: 25.0000 mg | ORAL_TABLET | Freq: Every day | ORAL | Status: DC

## 2015-09-16 MED ORDER — SODIUM CHLORIDE 0.9 % IV BOLUS (SEPSIS)
500.0000 mL | Freq: Once | INTRAVENOUS | Status: AC
  Administered 2015-09-16: 500 mL via INTRAVENOUS

## 2015-09-16 MED ORDER — LABETALOL HCL 5 MG/ML IV SOLN
10.0000 mg | Freq: Once | INTRAVENOUS | Status: AC
  Administered 2015-09-16: 10 mg via INTRAVENOUS
  Filled 2015-09-16: qty 4

## 2015-09-16 MED ORDER — METOCLOPRAMIDE HCL 5 MG/ML IJ SOLN
10.0000 mg | Freq: Once | INTRAMUSCULAR | Status: AC
  Administered 2015-09-16: 10 mg via INTRAVENOUS
  Filled 2015-09-16: qty 2

## 2015-09-16 MED ORDER — CLONIDINE HCL 0.2 MG PO TABS
0.2000 mg | ORAL_TABLET | Freq: Once | ORAL | Status: AC
  Administered 2015-09-16: 0.2 mg via ORAL
  Filled 2015-09-16: qty 1

## 2015-09-16 NOTE — ED Notes (Signed)
Pt here for headache, heart pounding, weakness and nausea. sts some vomiting. sts she never has HA. sts she has been tired for 5 months.

## 2015-09-16 NOTE — Discharge Instructions (Signed)
If you were given medicines take as directed.  If you are on coumadin or contraceptives realize their levels and effectiveness is altered by many different medicines.  If you have any reaction (rash, tongues swelling, other) to the medicines stop taking and see a physician.  Call your doctor Monday morning.  Very important to see a doctor to get your blood pressure controlled and further evaluation!! Start taking blood pressure medicines. If your blood pressure was elevated in the ER make sure you follow up for management with a primary doctor or return for chest pain, shortness of breath or stroke symptoms.  Please follow up as directed and return to the ER or see a physician for new or worsening symptoms.  Thank you. Filed Vitals:   09/16/15 1845 09/16/15 1855 09/16/15 1900 09/16/15 1916  BP: 189/114 170/112 183/107 172/95  Pulse: 97 86 84 83  Temp:      TempSrc:      Resp: 18 16 13 15   SpO2: 97% 97% 98% 98%

## 2015-09-16 NOTE — ED Provider Notes (Signed)
CSN: [id number]     Arrival date & time 09/16/15  1410 History   First MD Initiated Contact with Patient 09/16/15 1657     Chief Complaint  Patient presents with  . Headache  . Nausea  . Palpitations     (Consider location/radiation/quality/duration/timing/severity/associated sxs/prior Treatment) HPI Comments: 55 year old female with no significant medical history, no regular doctor however has seen [facility] presents with intermittent gradual onset worsening headache him a palpitations, intermittent nonradiating  and nausea worsening for the past 4 months. Patient has had the headaches for months now. Patient is not on any blood pressure medications. Past smoker however occasionally smokes. No cardiac history blood clot history, no recent surgery. Mild shortness of breath. No classic blood clot risk factors.  Patient is a 55 y.o. female presenting with headaches and palpitations. The history is provided by the patient.  Headache Associated symptoms: nausea and vomiting   Associated symptoms: no abdominal pain, no back pain, no congestion, no fever, no neck pain and no neck stiffness   Palpitations Associated symptoms: nausea, shortness of breath and vomiting   Associated symptoms: no back pain and no chest pain     Past Medical History  Diagnosis Date  . Depression    Past Surgical History  Procedure Laterality Date  . Appendectomy    . Cosmetic surgery    . Cesarean section     Family History  Problem Relation Age of Onset  . Heart disease Mother   . Stroke Mother   . Heart disease Father   . Healthy Sister   . Healthy Brother   . Healthy Brother   . Healthy Sister    Social History  Substance Use Topics  . Smoking status: Former Smoker    Quit date: 05/20/2004  . Smokeless tobacco: None  . Alcohol Use: None   OB History    No data available     Review of Systems  Constitutional: Positive for appetite change. Negative for fever and chills.  HENT:  Negative for congestion.   Eyes: Negative for visual disturbance.  Respiratory: Positive for shortness of breath.   Cardiovascular: Positive for palpitations. Negative for chest pain.  Gastrointestinal: Positive for nausea and vomiting. Negative for abdominal pain.  Genitourinary: Negative for dysuria and flank pain.  Musculoskeletal: Negative for back pain, neck pain and neck stiffness.  Skin: Negative for rash.  Neurological: Positive for light-headedness and headaches.      Allergies  Codeine; Penicillins; and Sulfa drugs cross reactors  Home Medications   Prior to Admission medications   Medication Sig Start Date End Date Taking? Authorizing Provider  DULoxetine (CYMBALTA) 60 MG capsule Take 60 mg by mouth daily. 06/30/15  Yes Historical Provider, MD  hydrochlorothiazide (HYDRODIURIL) 25 MG tablet Take 1 tablet (25 mg total) by mouth daily. 09/16/15   Blane Ohara, MD   BP 172/95 mmHg  Pulse 83  Temp(Src) 98.4 F (36.9 C) (Oral)  Resp 15  SpO2 98% Physical Exam  Constitutional: She is oriented to person, place, and time. She appears well-developed and well-nourished.  HENT:  Head: Normocephalic and atraumatic.  Eyes: Right eye exhibits no discharge. Left eye exhibits no discharge.  Neck: Normal range of motion. Neck supple. No tracheal deviation present.  Cardiovascular: Normal rate and regular rhythm.   Pulmonary/Chest: Effort normal and breath sounds normal.  Abdominal: Soft. She exhibits no distension. There is no tenderness. There is no guarding.  Musculoskeletal: She exhibits no edema.  Neurological: She is alert  and oriented to person, place, and time. GCS eye subscore is 4. GCS verbal subscore is 5. GCS motor subscore is 6.  5+ strength in UE and LE with f/e at major joints. Sensation to palpation intact in UE and LE. CNs 2-12 grossly intact.  EOMFI.  PERRL.   Finger nose and coordination intact bilateral.   Visual fields intact to finger testing. No nystagmus    Skin: Skin is warm. No rash noted.  Psychiatric: She has a normal mood and affect.  Nursing note and vitals reviewed.   ED Course  Procedures (including critical care time) Labs Review Labs Reviewed  BASIC METABOLIC PANEL - Abnormal; Notable for the following:    Potassium 3.4 (*)    Glucose, Bld 101 (*)    All other components within normal limits  CBC  URINALYSIS, ROUTINE W REFLEX MICROSCOPIC (NOT AT [facility])  D-DIMER, QUANTITATIVE (NOT AT [facility])  TSH  I-STAT TROPOININ, ED  Rosezena Sensor, ED    Imaging Review Dg Chest 2 View  09/16/2015  CLINICAL DATA:  Palpitation and headache for about 4 weeks, weakness EXAM: CHEST  2 VIEW COMPARISON:  05/20/2012 FINDINGS: Cardiomediastinal silhouette is stable. No acute infiltrate or pleural effusion. No pulmonary edema. Bony thorax is unremarkable. Bilateral breast implants again noted. IMPRESSION: No active cardiopulmonary disease. Electronically Signed   By: Natasha Mead M.D.   On: 09/16/2015 14:55   Ct Head Wo Contrast  09/16/2015  CLINICAL DATA:  Headache and nausea with heart palpitations today Elevated blood pressure EXAM: CT HEAD WITHOUT CONTRAST TECHNIQUE: Contiguous axial images were obtained from the base of the skull through the vertex without intravenous contrast. COMPARISON:  None. FINDINGS: No acute intracranial hemorrhage. No focal mass lesion. No CT evidence of acute infarction. No midline shift or mass effect. No hydrocephalus. Basilar cisterns are patent. Paranasal sinuses and  mastoid air cells are clear. IMPRESSION: Normal head CT. Electronically Signed   By: Genevive Bi M.D.   On: 09/16/2015 18:35   I have personally reviewed and evaluated these images and lab results as part of my medical decision-making.   EKG Interpretation   Date/Time:  Friday September 16 2015 14:28:53 EST Ventricular Rate:  102 PR Interval:  132 QRS Duration: 90 QT Interval:  360 QTC Calculation: 469 R Axis:   103 Text Interpretation:  Sinus  tachycardia Right atrial enlargement Rightward  axis Borderline ECG Confirmed by Kymoni Monday  MD, Eshawn Coor ([id number]) on 09/16/2015  5:04:37 PM      MDM   Final diagnoses:  Headache, unspecified headache type  Heart palpitations  Essential hypertension   Patient presents with worsening headache and multiple other symptoms for the past few months. Patient blood pressure elevated in the ER, patient says she is no history of elevated blood pressure in these headaches are different than normal, gradual onset. Discussed concern for headaches different than usual and significant elevated blood pressure which may be related to all her symptoms. Plan for migraine cocktail and blood pressure medication. CT head pending. No chest pain on exam. Patient low risk cardiac however patient does not follow-up closely outpatient.  Blood pressure improved in the ER. Discussed importance of blood pressure management. Discussed observation the hospital versus close outpatient follow-up, patient does not want to come the hospital and says she will follow the primary doctor Monday and will start taking medicines.  The patients results and plan were reviewed and discussed.   Any x-rays performed were independently reviewed by myself.   Differential diagnosis  were considered with the presenting HPI.  Medications  metoCLOPramide (REGLAN) injection 10 mg (10 mg Intravenous Given 09/16/15 1759)  sodium chloride 0.9 % bolus 500 mL (0 mLs Intravenous Stopped 09/16/15 1843)  diphenhydrAMINE (BENADRYL) injection 25 mg (25 mg Intravenous Given 09/16/15 1802)  labetalol (NORMODYNE,TRANDATE) injection 10 mg (10 mg Intravenous Given 09/16/15 1847)  cloNIDine (CATAPRES) tablet 0.2 mg (0.2 mg Oral Given 09/16/15 1917)    Filed Vitals:   09/16/15 1845 09/16/15 1855 09/16/15 1900 09/16/15 1916  BP: 189/114 170/112 183/107 172/95  Pulse: 97 86 84 83  Temp:      TempSrc:      Resp: 18 16 13 15   SpO2: 97% 97% 98% 98%    Final diagnoses:   Headache, unspecified headache type  Heart palpitations  Essential hypertension    Admission/ observation were discussed with the admitting physician, patient and/or family and they are comfortable with the plan.     , MD 09/17/15 [phone]

## 2015-09-20 ENCOUNTER — Other Ambulatory Visit: Payer: Self-pay | Admitting: Family Medicine

## 2015-09-20 ENCOUNTER — Ambulatory Visit (HOSPITAL_COMMUNITY): Payer: BLUE CROSS/BLUE SHIELD | Attending: Cardiovascular Disease

## 2015-09-20 ENCOUNTER — Other Ambulatory Visit: Payer: Self-pay

## 2015-09-20 DIAGNOSIS — I10 Essential (primary) hypertension: Secondary | ICD-10-CM | POA: Insufficient documentation

## 2015-09-20 DIAGNOSIS — Z87891 Personal history of nicotine dependence: Secondary | ICD-10-CM | POA: Diagnosis not present

## 2015-09-20 DIAGNOSIS — R0600 Dyspnea, unspecified: Secondary | ICD-10-CM | POA: Diagnosis present

## 2015-09-20 DIAGNOSIS — I517 Cardiomegaly: Secondary | ICD-10-CM | POA: Diagnosis not present

## 2015-09-20 DIAGNOSIS — Z8249 Family history of ischemic heart disease and other diseases of the circulatory system: Secondary | ICD-10-CM | POA: Insufficient documentation

## 2015-09-26 ENCOUNTER — Other Ambulatory Visit: Payer: Self-pay | Admitting: Family Medicine

## 2015-09-26 DIAGNOSIS — R1032 Left lower quadrant pain: Secondary | ICD-10-CM

## 2015-09-26 DIAGNOSIS — R1031 Right lower quadrant pain: Secondary | ICD-10-CM

## 2015-09-26 DIAGNOSIS — I10 Essential (primary) hypertension: Secondary | ICD-10-CM

## 2015-09-27 ENCOUNTER — Other Ambulatory Visit: Payer: Self-pay | Admitting: Family Medicine

## 2015-09-27 ENCOUNTER — Ambulatory Visit
Admission: RE | Admit: 2015-09-27 | Discharge: 2015-09-27 | Disposition: A | Discharge status: Home / Self Care | Payer: BLUE CROSS/BLUE SHIELD | Source: Ambulatory Visit | Attending: Family Medicine | Admitting: Family Medicine

## 2015-09-27 DIAGNOSIS — R0602 Shortness of breath: Secondary | ICD-10-CM

## 2015-09-27 DIAGNOSIS — R030 Elevated blood-pressure reading, without diagnosis of hypertension: Secondary | ICD-10-CM

## 2015-09-27 DIAGNOSIS — R000 Tachycardia, unspecified: Secondary | ICD-10-CM

## 2015-09-27 DIAGNOSIS — IMO0001 Reserved for inherently not codable concepts without codable children: Secondary | ICD-10-CM

## 2015-09-27 MED ORDER — IOPAMIDOL (ISOVUE-370) INJECTION 76%
100.0000 mL | Freq: Once | INTRAVENOUS | Status: AC | PRN
  Administered 2015-09-27: 100 mL via INTRAVENOUS

## 2015-09-29 ENCOUNTER — Encounter: Payer: Self-pay | Admitting: Cardiovascular Disease

## 2015-09-29 ENCOUNTER — Ambulatory Visit (INDEPENDENT_AMBULATORY_CARE_PROVIDER_SITE_OTHER): Payer: BLUE CROSS/BLUE SHIELD | Admitting: Cardiovascular Disease

## 2015-09-29 VITALS — BP 126/78 | HR 68 | Ht 65.5 in | Wt 124.8 lb

## 2015-09-29 DIAGNOSIS — R079 Chest pain, unspecified: Secondary | ICD-10-CM | POA: Diagnosis not present

## 2015-09-29 DIAGNOSIS — R0600 Dyspnea, unspecified: Secondary | ICD-10-CM

## 2015-09-29 DIAGNOSIS — I10 Essential (primary) hypertension: Secondary | ICD-10-CM

## 2015-09-29 DIAGNOSIS — Z79899 Other long term (current) drug therapy: Secondary | ICD-10-CM

## 2015-09-29 DIAGNOSIS — R0609 Other forms of dyspnea: Secondary | ICD-10-CM

## 2015-09-29 DIAGNOSIS — R002 Palpitations: Secondary | ICD-10-CM | POA: Insufficient documentation

## 2015-09-29 DIAGNOSIS — E7800 Pure hypercholesterolemia, unspecified: Secondary | ICD-10-CM

## 2015-09-29 NOTE — Assessment & Plan Note (Signed)
This finding relates approximately a year of dyspnea on exertion. She is a former tobacco user having smoked 10-12-pack-years and stopped 7 years ago. Recent 2-D echo showed grade 1 diastolic dysfunction with mild LVH. I'm going to get a exercise Myoview to rule out an ischemic etiology.

## 2015-09-29 NOTE — Assessment & Plan Note (Signed)
History palpitations occur on a daily basis. She denies caffeine intake. I'm going to get a 2 week event monitor.

## 2015-09-29 NOTE — Assessment & Plan Note (Signed)
Mrs. Gianino is seen today for evaluation of new-onset hypertension and dyspnea. She has not been hypertensive before. Recent 2-D echo showed mild LVH. She does admit to eating 4-6 pickles today and it chart of eyelids. We talked about the importance of salt reduction. She is on atenolol and chlorthalidone. I'm going to get a renal Doppler was ordered to rule out renal vascular hypertension

## 2015-09-29 NOTE — Progress Notes (Signed)
09/29/2015 [name]   [birth year]  [id number]  Primary Physician Farris Has, MD Primary Cardiologist: Runell Gess MD Roseanne Reno   HPI:  [name] is a 55 year old thin appearing engaged Caucasian female mother of 3 children (2 biologic) and one grandson referred by Dr. Ricci Barker at [facility] for evaluation of new onset hypertension, dyspnea fatigue and palpitations. Her cardiac risk factor profile is notable for 12 years of tobacco abuse having quit 7 years ago. She does drink 2-4 beers a night. Her father had CAD. She said new-onset hypertension last several weeks with ER visit for headache. She had negative head CT and a chest CT that was negative for pulmonary emboli. Coronary calcification were not mentioned. She is complaining of fatigue the last year and increasing dyspnea on exertion. Recent 2-D echo showed normal LV function, mild LVH with grade 1 diastolic dysfunction.   Current Outpatient Prescriptions  Medication Sig Dispense Refill  . atenolol-chlorthalidone (TENORETIC) 50-25 MG tablet Take 1 tablet by mouth daily.     No current facility-administered medications for this visit.    Allergies  Allergen Reactions  . Codeine Nausea And Vomiting  . Penicillins Rash  . Sulfa Drugs Cross Reactors Rash    Social History   Social History  . Marital Status: Divorced    Spouse Name: N/A  . Number of Children: N/A  . Years of Education: N/A   Occupational History  . Not on file.   Social History Main Topics  . Smoking status: Current Some Day Smoker    Last Attempt to Quit: 05/20/2004  . Smokeless tobacco: Not on file     Comment: social smoking  . Alcohol Use: Not on file  . Drug Use: Not on file  . Sexual Activity: Not on file   Other Topics Concern  . Not on file   Social History Narrative     Review of Systems: General: negative for chills, fever, night sweats or weight changes.  Cardiovascular: negative for chest pain, dyspnea on  exertion, edema, orthopnea, palpitations, paroxysmal nocturnal dyspnea or shortness of breath Dermatological: negative for rash Respiratory: negative for cough or wheezing Urologic: negative for hematuria Abdominal: negative for nausea, vomiting, diarrhea, bright red blood per rectum, melena, or hematemesis Neurologic: negative for visual changes, syncope, or dizziness All other systems reviewed and are otherwise negative except as noted above.    Blood pressure 126/78, pulse 68, height 5' 5.5" (1.664 m), weight 124 lb 12.8 oz (56.609 kg).  General appearance: alert and no distress Neck: no adenopathy, no carotid bruit, no JVD, supple, symmetrical, trachea midline and thyroid not enlarged, symmetric, no tenderness/mass/nodules Lungs: clear to auscultation bilaterally Heart: regular rate and rhythm, S1, S2 normal, no murmur, click, rub or gallop Extremities: extremities normal, atraumatic, no cyanosis or edema  EKG not performed today  ASSESSMENT AND PLAN:   Essential hypertension [name] is seen today for evaluation of new-onset hypertension and dyspnea. She has not been hypertensive before. Recent 2-D echo showed mild LVH. She does admit to eating 4-6 pickles today and it chart of eyelids. We talked about the importance of salt reduction. She is on atenolol and chlorthalidone. I'm going to get a renal Doppler was ordered to rule out renal vascular hypertension  Dyspnea on exertion This finding relates approximately a year of dyspnea on exertion. She is a former tobacco user having smoked 10-12-pack-years and stopped 7 years ago. Recent 2-D echo showed grade 1 diastolic dysfunction with mild LVH. I'm  going to get a exercise Myoview to rule out an ischemic etiology.  Palpitations History palpitations occur on a daily basis. She denies caffeine intake. I'm going to get a 2 week event monitor.      Runell Gess MD FACP,FACC,FAHA, [facility] 09/29/2015 2:15 PM

## 2015-09-29 NOTE — Patient Instructions (Signed)
Your physician has requested that you have a renal artery duplex. During this test, an ultrasound is used to evaluate blood flow to the kidneys. Allow one hour for this exam. Do not eat after midnight the day before and avoid carbonated beverages. Take your medications as you usually do.  Your physician has requested that you have en exercise stress myoview. For further information please visit [email or web address] Please follow instruction sheet, as given.  Your physician has recommended that you wear an event monitor. Event monitors are medical devices that record the heart's electrical activity. Doctors most often [place] these monitors to diagnose arrhythmias. Arrhythmias are problems with the speed or rhythm of the heartbeat. The monitor is a small, portable device. You can wear one while you do your normal daily activities. This is usually used to diagnose what is causing palpitations/syncope (passing out).  Your physician recommends that you return for lab work in: FASTING AT [facility]  Your physician recommends that you schedule a follow-up appointment in: 2 MONTHS

## 2015-10-06 ENCOUNTER — Telehealth (HOSPITAL_COMMUNITY): Payer: Self-pay

## 2015-10-06 NOTE — Telephone Encounter (Signed)
Encounter complete. 

## 2015-10-10 ENCOUNTER — Ambulatory Visit
Admission: RE | Admit: 2015-10-10 | Discharge: 2015-10-10 | Disposition: A | Discharge status: Home / Self Care | Payer: BLUE CROSS/BLUE SHIELD | Source: Ambulatory Visit | Attending: Family Medicine | Admitting: Family Medicine

## 2015-10-10 DIAGNOSIS — R1032 Left lower quadrant pain: Secondary | ICD-10-CM

## 2015-10-10 DIAGNOSIS — I10 Essential (primary) hypertension: Secondary | ICD-10-CM

## 2015-10-10 DIAGNOSIS — R1031 Right lower quadrant pain: Secondary | ICD-10-CM

## 2015-10-11 ENCOUNTER — Ambulatory Visit (HOSPITAL_COMMUNITY)
Admission: RE | Admit: 2015-10-11 | Discharge: 2015-10-11 | Disposition: A | Discharge status: Home / Self Care | Payer: BLUE CROSS/BLUE SHIELD | Source: Ambulatory Visit | Attending: Cardiovascular Disease | Admitting: Cardiovascular Disease

## 2015-10-11 ENCOUNTER — Ambulatory Visit (HOSPITAL_COMMUNITY)
Admission: RE | Admit: 2015-10-11 | Discharge: 2015-10-11 | Disposition: A | Discharge status: Home / Self Care | Payer: BLUE CROSS/BLUE SHIELD | Source: Ambulatory Visit | Attending: Urology | Admitting: Urology

## 2015-10-11 DIAGNOSIS — I10 Essential (primary) hypertension: Secondary | ICD-10-CM

## 2015-10-11 DIAGNOSIS — R5383 Other fatigue: Secondary | ICD-10-CM | POA: Diagnosis not present

## 2015-10-11 DIAGNOSIS — R079 Chest pain, unspecified: Secondary | ICD-10-CM | POA: Diagnosis present

## 2015-10-11 DIAGNOSIS — R0609 Other forms of dyspnea: Secondary | ICD-10-CM | POA: Insufficient documentation

## 2015-10-11 DIAGNOSIS — R002 Palpitations: Secondary | ICD-10-CM | POA: Diagnosis not present

## 2015-10-11 DIAGNOSIS — Z8249 Family history of ischemic heart disease and other diseases of the circulatory system: Secondary | ICD-10-CM | POA: Diagnosis not present

## 2015-10-11 DIAGNOSIS — I779 Disorder of arteries and arterioles, unspecified: Secondary | ICD-10-CM | POA: Diagnosis not present

## 2015-10-11 DIAGNOSIS — F17200 Nicotine dependence, unspecified, uncomplicated: Secondary | ICD-10-CM | POA: Insufficient documentation

## 2015-10-11 MED ORDER — TECHNETIUM TC 99M SESTAMIBI GENERIC - CARDIOLITE
10.9000 | Freq: Once | INTRAVENOUS | Status: AC | PRN
  Administered 2015-10-11: 10.9 via INTRAVENOUS

## 2015-10-11 MED ORDER — TECHNETIUM TC 99M SESTAMIBI GENERIC - CARDIOLITE
30.8000 | Freq: Once | INTRAVENOUS | Status: AC | PRN
  Administered 2015-10-11: 30.8 via INTRAVENOUS

## 2015-10-12 LAB — MYOCARDIAL PERFUSION IMAGING
CHL CUP NUCLEAR SDS: 6
CHL CUP RESTING HR STRESS: 75 {beats}/min
CHL CUP STRESS STAGE 10 DBP: 87 mmHg
CHL CUP STRESS STAGE 10 HR: 80 {beats}/min
CHL CUP STRESS STAGE 10 SBP: 135 mmHg
CHL CUP STRESS STAGE 10 SPEED: 0 mph
CHL CUP STRESS STAGE 2 GRADE: 0 %
CHL CUP STRESS STAGE 3 GRADE: 0.1 %
CHL CUP STRESS STAGE 3 HR: 93 {beats}/min
CHL CUP STRESS STAGE 3 SPEED: 1 mph
CHL CUP STRESS STAGE 4 SBP: 137 mmHg
CHL CUP STRESS STAGE 4 SPEED: 1.7 mph
CHL CUP STRESS STAGE 5 GRADE: 12 %
CHL CUP STRESS STAGE 5 HR: 111 {beats}/min
CHL CUP STRESS STAGE 5 SPEED: 2.5 mph
CHL CUP STRESS STAGE 6 DBP: 80 mmHg
CHL CUP STRESS STAGE 6 GRADE: 14 %
CHL CUP STRESS STAGE 6 HR: 129 {beats}/min
CHL CUP STRESS STAGE 6 SBP: 162 mmHg
CHL CUP STRESS STAGE 6 SPEED: 3.4 mph
CHL CUP STRESS STAGE 7 DBP: 100 mmHg
CHL CUP STRESS STAGE 7 HR: 146 {beats}/min
CHL CUP STRESS STAGE 7 SBP: 193 mmHg
CHL CUP STRESS STAGE 8 SPEED: 4.2 mph
CHL CUP STRESS STAGE 9 DBP: 93 mmHg
CHL CUP STRESS STAGE 9 GRADE: 0 %
CHL CUP STRESS STAGE 9 SBP: 165 mmHg
CHL CUP STRESS STAGE 9 SPEED: 0 mph
CHL RATE OF PERCEIVED EXERTION: 16
CSEPED: 12 min
CSEPHR: 87 %
CSEPPHR: 146 {beats}/min
CSEPPMHR: 87 %
Estimated workload: 13.4 METS
LV dias vol: 72 mL
LV sys vol: 29 mL
MPHR: 167 {beats}/min
SRS: 1
SSS: 7
Stage 1 DBP: 86 mmHg
Stage 1 Grade: 0 %
Stage 1 HR: 76 {beats}/min
Stage 1 SBP: 116 mmHg
Stage 1 Speed: 0 mph
Stage 10 Grade: 0 %
Stage 2 HR: 93 {beats}/min
Stage 2 Speed: 1 mph
Stage 4 DBP: 75 mmHg
Stage 4 Grade: 10 %
Stage 4 HR: 95 {beats}/min
Stage 5 DBP: 75 mmHg
Stage 5 SBP: 145 mmHg
Stage 7 Grade: 16 %
Stage 7 Speed: 4.2 mph
Stage 8 Grade: 16.2 %
Stage 8 HR: 146 {beats}/min
Stage 9 HR: 125 {beats}/min
TID: 0.83

## 2015-10-13 ENCOUNTER — Telehealth: Payer: Self-pay | Admitting: Cardiovascular Disease

## 2015-10-13 NOTE — Telephone Encounter (Signed)
Pt would like her results from her doppler and stress test from 10-11-15 please.If she is not there,please leave her a message.

## 2015-10-13 NOTE — Telephone Encounter (Signed)
Left message the MD is out of office but will call with final results next.

## 2015-10-20 ENCOUNTER — Telehealth: Payer: Self-pay | Admitting: Cardiovascular Disease

## 2015-10-20 NOTE — Telephone Encounter (Signed)
Pt notified of results

## 2015-10-20 NOTE — Telephone Encounter (Signed)
Received records from [facility] for appointment on 11/30/15 with Dr Allyson Sabal.  Records given to [employer] (medical records) for Dr Hazle Coca schedule on 11/30/15. lp

## 2015-10-27 ENCOUNTER — Encounter (HOSPITAL_COMMUNITY): Payer: Self-pay | Admitting: Emergency Medicine

## 2015-10-27 ENCOUNTER — Emergency Department (HOSPITAL_COMMUNITY)
Admission: EM | Admit: 2015-10-27 | Discharge: 2015-10-27 | Disposition: A | Discharge status: Home / Self Care | Payer: BLUE CROSS/BLUE SHIELD | Attending: Emergency Medicine | Admitting: Emergency Medicine

## 2015-10-27 DIAGNOSIS — I10 Essential (primary) hypertension: Secondary | ICD-10-CM | POA: Insufficient documentation

## 2015-10-27 DIAGNOSIS — I739 Peripheral vascular disease, unspecified: Secondary | ICD-10-CM | POA: Insufficient documentation

## 2015-10-27 DIAGNOSIS — F17200 Nicotine dependence, unspecified, uncomplicated: Secondary | ICD-10-CM | POA: Diagnosis not present

## 2015-10-27 HISTORY — DX: Cardiomegaly: I51.7

## 2015-10-27 NOTE — ED Notes (Signed)
Pt was sent here to have a doppler study of her left leg  Pt states she has a knot behind her left knee that started hurting on Tuesday evening and has progressively gotten worse  Pt states it hurts worse if you touch it  Pt states the area is red and warm to touch

## 2015-10-27 NOTE — ED Notes (Signed)
Pt told registration clerk she was going to leave and would follow up with her dr in the morning

## 2015-11-30 ENCOUNTER — Encounter: Payer: Self-pay | Admitting: Cardiovascular Disease

## 2015-11-30 ENCOUNTER — Ambulatory Visit (INDEPENDENT_AMBULATORY_CARE_PROVIDER_SITE_OTHER): Payer: BLUE CROSS/BLUE SHIELD | Admitting: Cardiovascular Disease

## 2015-11-30 VITALS — BP 123/80 | HR 65 | Ht 65.0 in | Wt 121.8 lb

## 2015-11-30 DIAGNOSIS — R002 Palpitations: Secondary | ICD-10-CM

## 2015-11-30 DIAGNOSIS — R0609 Other forms of dyspnea: Secondary | ICD-10-CM | POA: Diagnosis not present

## 2015-11-30 DIAGNOSIS — I10 Essential (primary) hypertension: Secondary | ICD-10-CM

## 2015-11-30 NOTE — Assessment & Plan Note (Signed)
Mrs. Schier had 2-D echo which was essentially normal. Was notable for mild LVH and grade 1 diastolic dysfunction with normal valvular function. A Myoview stress test was normal as well.

## 2015-11-30 NOTE — Progress Notes (Signed)
Mrs. Berke returns today for follow-up of her outpatient studies. A 2-D echocardiogram was essentially normal with grade 1 diastolic dysfunction and mild LVH. A Myoview stress test likewise was normal. I'm going to obtain a 2 week event monitor and will see her back in 6 months.

## 2015-11-30 NOTE — Assessment & Plan Note (Addendum)
History of hypertension blood pressure measured today at 123/80. She is on atenolol and chlorthalidone. Continue current meds at current dosing. She did have a renal Doppler study which was normal ruling out renal vascular hypertension.

## 2015-11-30 NOTE — Patient Instructions (Signed)
Medication Instructions:  Your physician recommends that you continue on your current medications as directed. Please refer to the Current Medication list given to you today.   Labwork: none  Testing/Procedures: Your physician has recommended that you wear an event monitor. Event monitors are medical devices that record the heart's electrical activity. Doctors most often [place] these monitors to diagnose arrhythmias. Arrhythmias are problems with the speed or rhythm of the heartbeat. The monitor is a small, portable device. You can wear one while you do your normal daily activities. This is usually used to diagnose what is causing palpitations/syncope (passing out). 2 WEEKS MONITOR    Follow-Up: Your physician wants you to follow-up in: 6 months with Dr. Allyson Sabal. You will receive a reminder letter in the mail two months in advance. If you don't receive a letter, please call our office to schedule the follow-up appointment.   Any Other Special Instructions Will Be Listed Below (If Applicable).     If you need a refill on your cardiac medications before your next appointment, please call your pharmacy.

## 2015-11-30 NOTE — Assessment & Plan Note (Addendum)
Swelling continues to have palpitations. She is under a lot of stress. She states she is caffeine free but does drink several beers a day. I'm unsure of her current hormonal status vis-a - vis menopause. I'm going to obtain a two-week event monitor.

## 2015-12-01 ENCOUNTER — Ambulatory Visit (INDEPENDENT_AMBULATORY_CARE_PROVIDER_SITE_OTHER): Payer: BLUE CROSS/BLUE SHIELD

## 2015-12-01 ENCOUNTER — Other Ambulatory Visit: Payer: Self-pay | Admitting: Cardiovascular Disease

## 2015-12-01 DIAGNOSIS — R002 Palpitations: Secondary | ICD-10-CM

## 2016-03-15 ENCOUNTER — Other Ambulatory Visit: Payer: Self-pay | Admitting: Family Medicine

## 2016-03-15 DIAGNOSIS — R911 Solitary pulmonary nodule: Secondary | ICD-10-CM

## 2016-03-21 ENCOUNTER — Ambulatory Visit
Admission: RE | Admit: 2016-03-21 | Discharge: 2016-03-21 | Disposition: A | Discharge status: Home / Self Care | Payer: BLUE CROSS/BLUE SHIELD | Source: Ambulatory Visit | Attending: Family Medicine | Admitting: Family Medicine

## 2016-03-21 DIAGNOSIS — R911 Solitary pulmonary nodule: Secondary | ICD-10-CM

## 2016-03-21 MED ORDER — IOPAMIDOL (ISOVUE-300) INJECTION 61%
75.0000 mL | Freq: Once | INTRAVENOUS | Status: AC | PRN
  Administered 2016-03-21: 75 mL via INTRAVENOUS

## 2016-04-10 ENCOUNTER — Other Ambulatory Visit: Payer: Self-pay | Admitting: Family Medicine

## 2016-04-10 DIAGNOSIS — M545 Low back pain: Secondary | ICD-10-CM

## 2016-04-18 ENCOUNTER — Ambulatory Visit
Admission: RE | Admit: 2016-04-18 | Discharge: 2016-04-18 | Disposition: A | Discharge status: Home / Self Care | Payer: BLUE CROSS/BLUE SHIELD | Source: Ambulatory Visit | Attending: Family Medicine | Admitting: Family Medicine

## 2016-04-18 DIAGNOSIS — M545 Low back pain: Secondary | ICD-10-CM

## 2016-12-27 ENCOUNTER — Other Ambulatory Visit: Payer: Self-pay | Admitting: Neurosurgery

## 2016-12-27 DIAGNOSIS — M5416 Radiculopathy, lumbar region: Secondary | ICD-10-CM

## 2017-01-04 ENCOUNTER — Ambulatory Visit
Admission: RE | Admit: 2017-01-04 | Discharge: 2017-01-04 | Disposition: A | Discharge status: Home / Self Care | Payer: BLUE CROSS/BLUE SHIELD | Source: Ambulatory Visit | Attending: Neurosurgery | Admitting: Neurosurgery

## 2017-01-04 DIAGNOSIS — M5416 Radiculopathy, lumbar region: Secondary | ICD-10-CM

## 2017-01-04 MED ORDER — DIAZEPAM 5 MG PO TABS
10.0000 mg | ORAL_TABLET | Freq: Once | ORAL | Status: AC
  Administered 2017-01-04: 10 mg via ORAL

## 2017-01-04 MED ORDER — IOPAMIDOL (ISOVUE-M 200) INJECTION 41%
15.0000 mL | Freq: Once | INTRAMUSCULAR | Status: AC
  Administered 2017-01-04: 15 mL via INTRATHECAL

## 2017-01-04 MED ORDER — ONDANSETRON 8 MG PO TBDP
8.0000 mg | ORAL_TABLET | Freq: Once | ORAL | Status: AC
  Administered 2017-01-04: 8 mg via ORAL

## 2017-01-04 MED ORDER — ONDANSETRON HCL 4 MG/2ML IJ SOLN: 4.0000 mg | Freq: Once | INTRAMUSCULAR | Status: DC

## 2017-01-04 NOTE — Discharge Instructions (Signed)

## 2017-02-14 IMAGING — DX DG CHEST 2V
2 series · 2 of 2 positions shown · non-contrast
Comparison: 05/20/2012

CLINICAL DATA: Palpitation and headache for about 4 weeks, weakness

EXAM:
CHEST  2 VIEW

[chest pa]
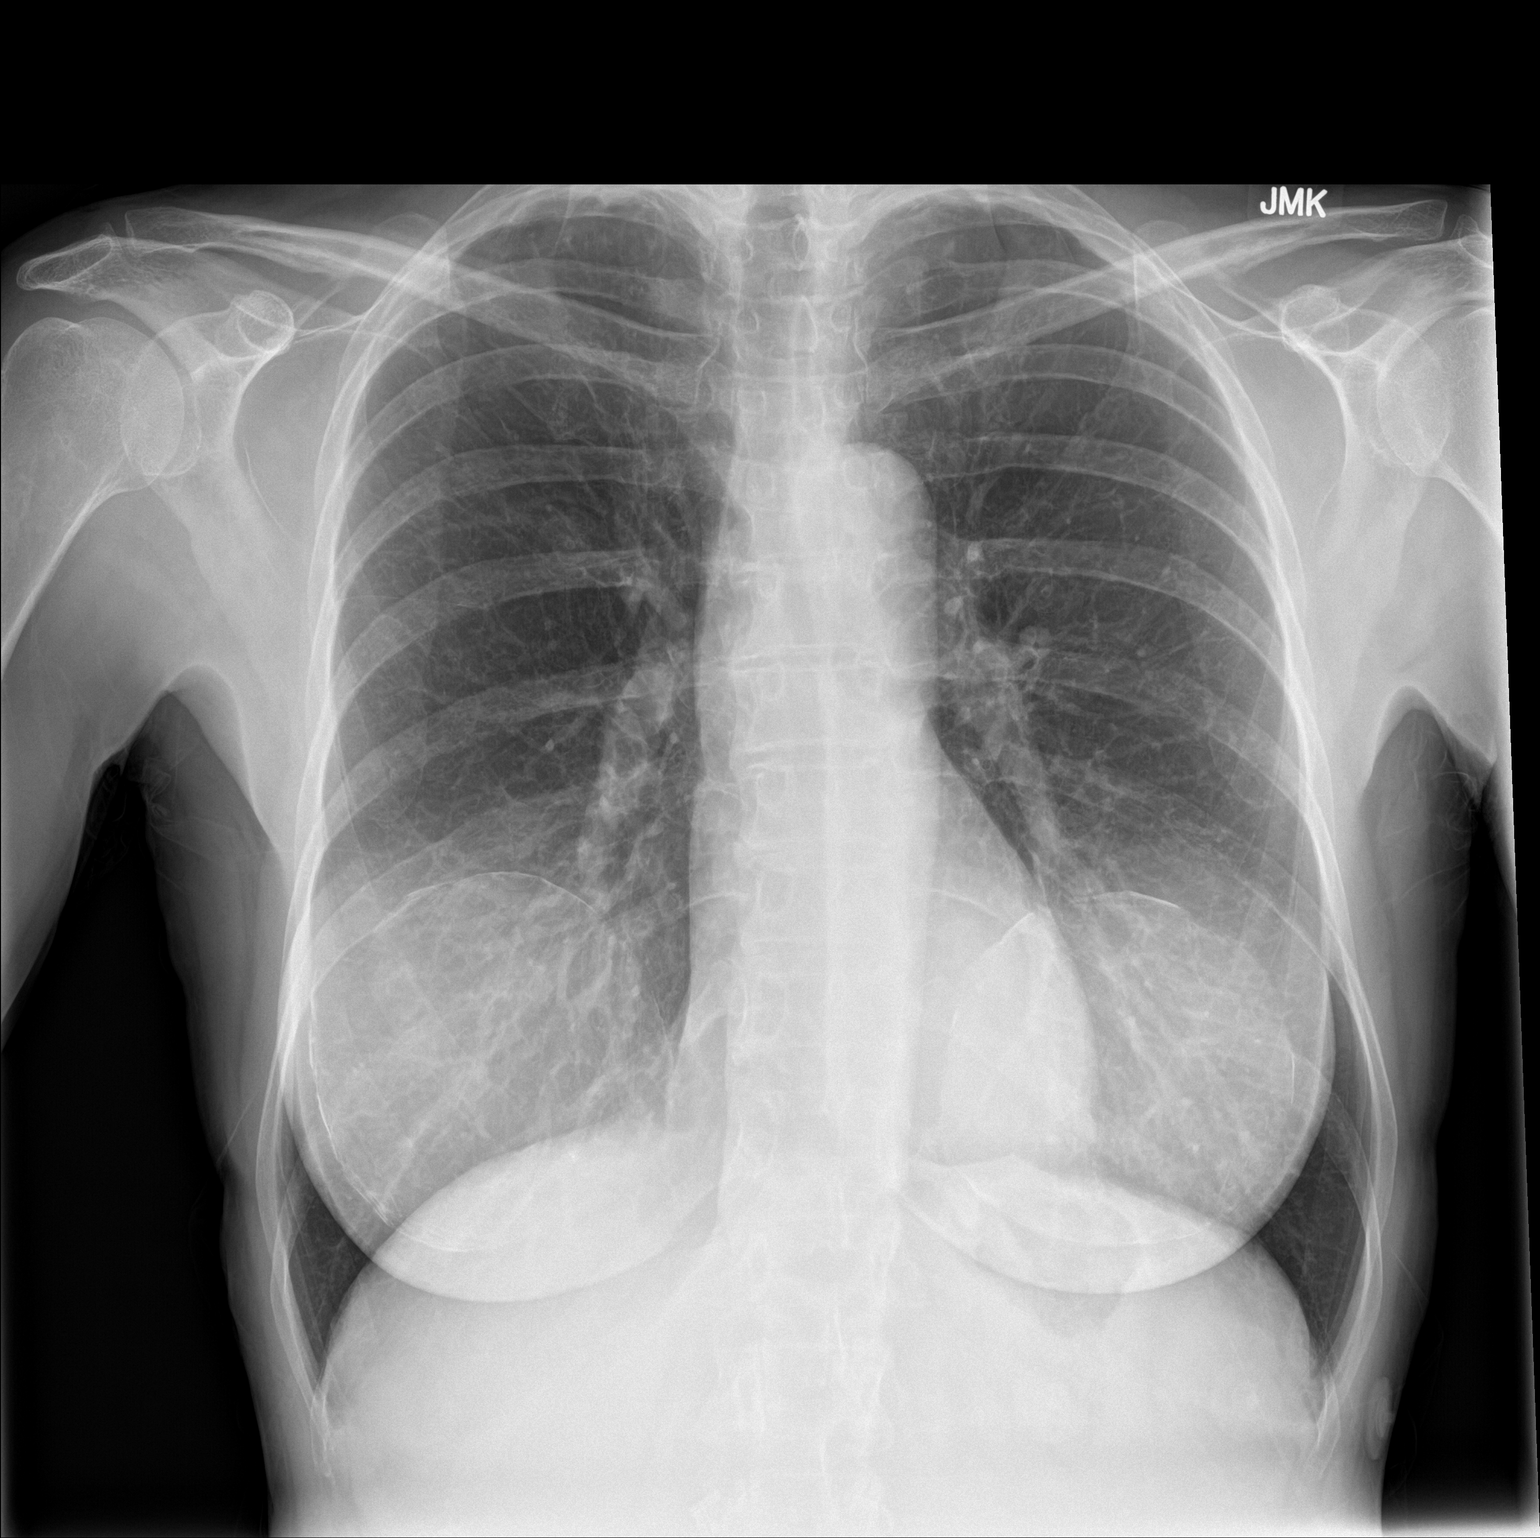

[chest lat]
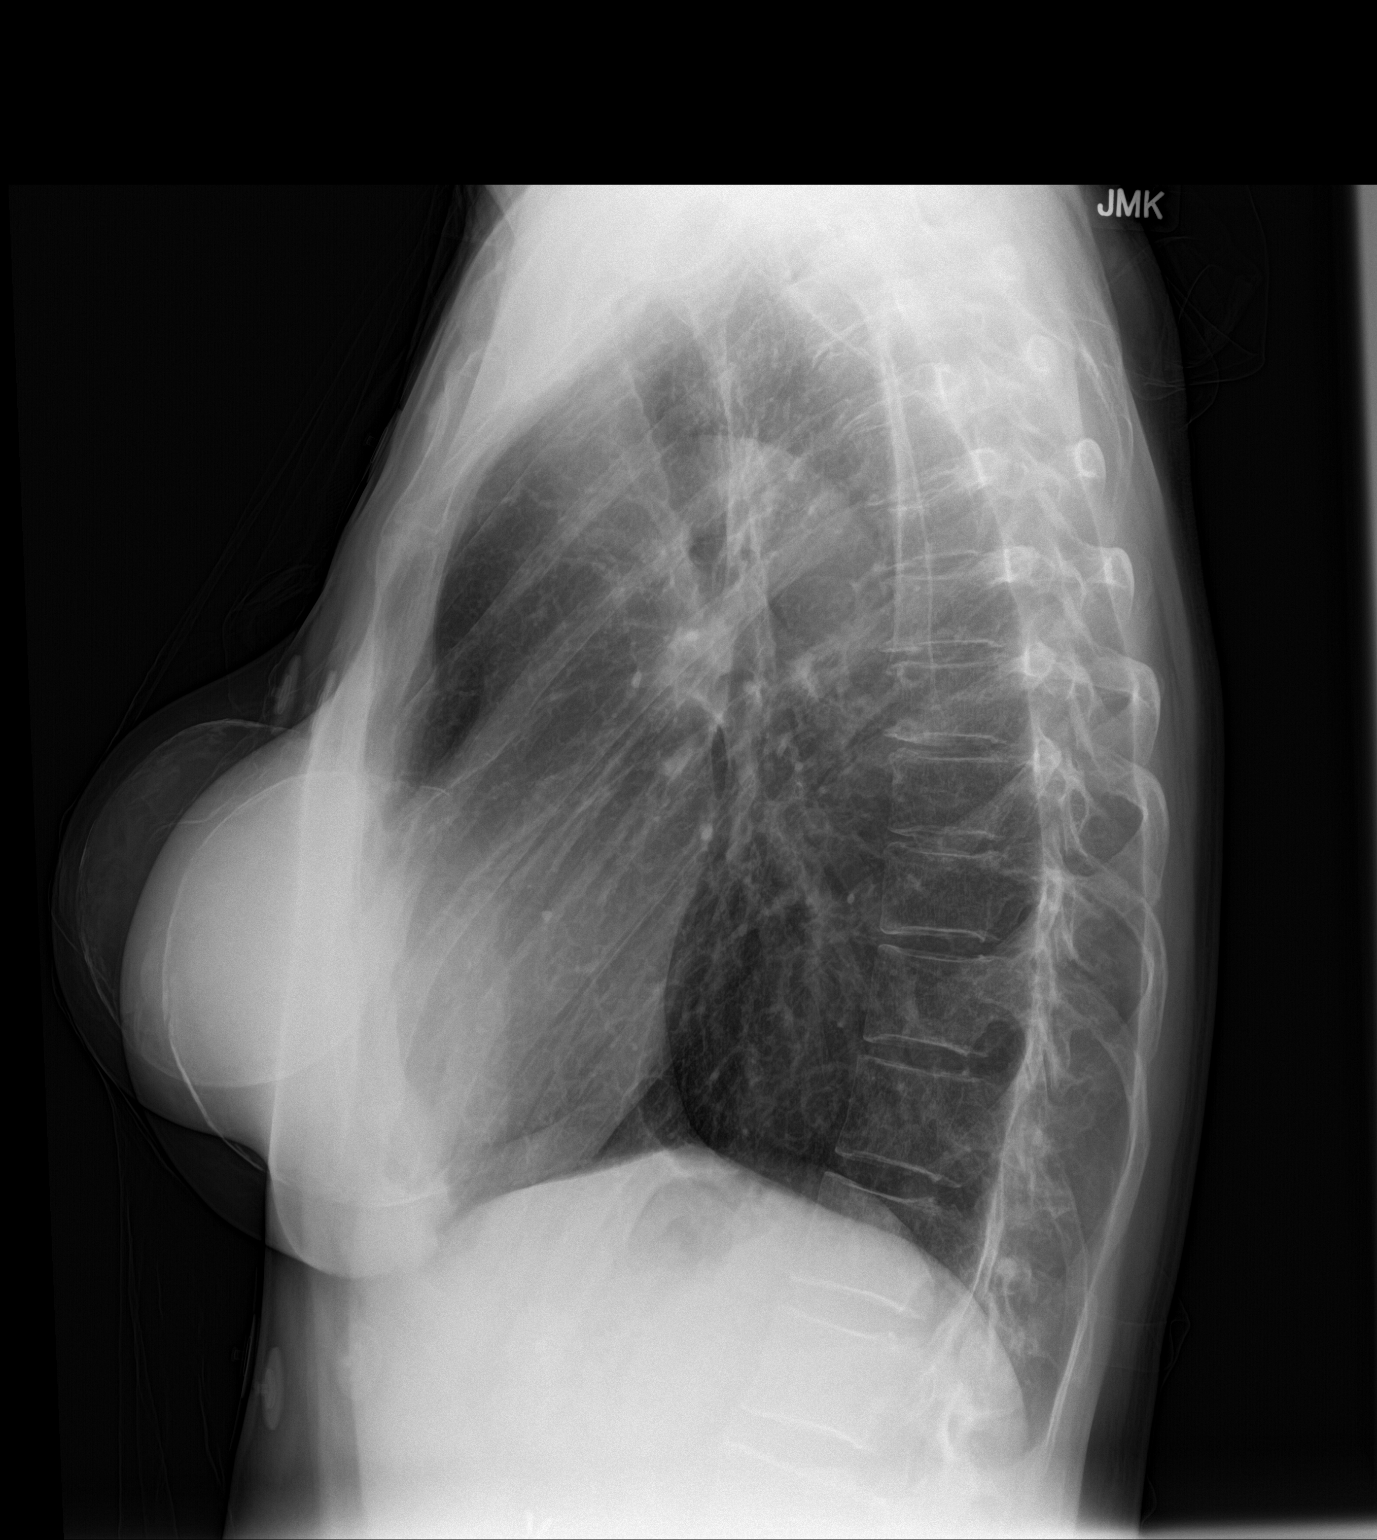

[2 of 2 positions shown; findings below may reference images not displayed]

FINDINGS: Cardiomediastinal silhouette is stable. No acute infiltrate or
pleural effusion. No pulmonary edema. Bony thorax is unremarkable.
Bilateral breast implants again noted.
IMPRESSION: No active cardiopulmonary disease.

## 2017-03-10 IMAGING — US US TRANSVAGINAL NON-OB
1 series · 13 of 25 positions shown · non-contrast
Comparison: Abdominal and pelvic CT scan May 02, 2007

CLINICAL DATA: New onset of lower abdominal pain and cramping ;
symptoms intermittently for the past 5 months, history of previous
myomectomy. The patient is postmenopausal



[Series 1: us transvaginal non-ob · 0.18mm/px · 13 of 51 slices shown]
[im 1/51]
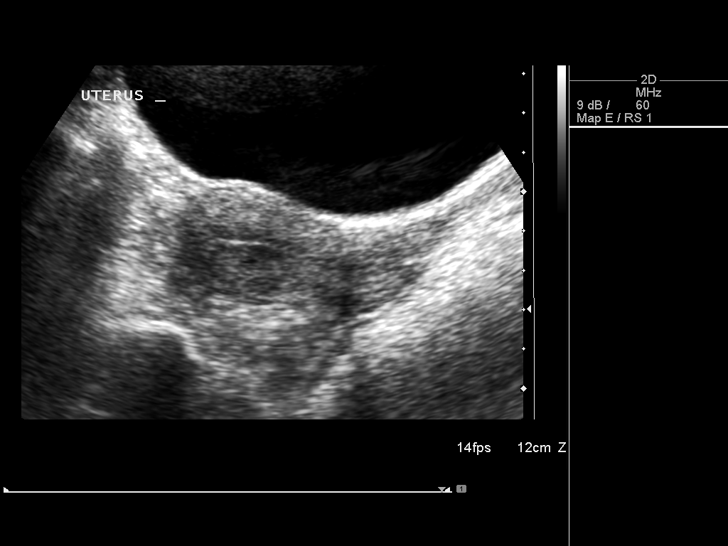
[im 5/51]
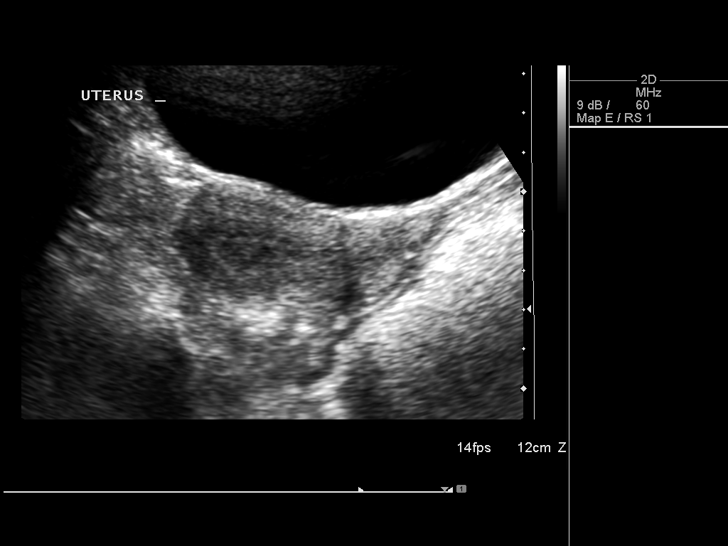
[im 9/51]
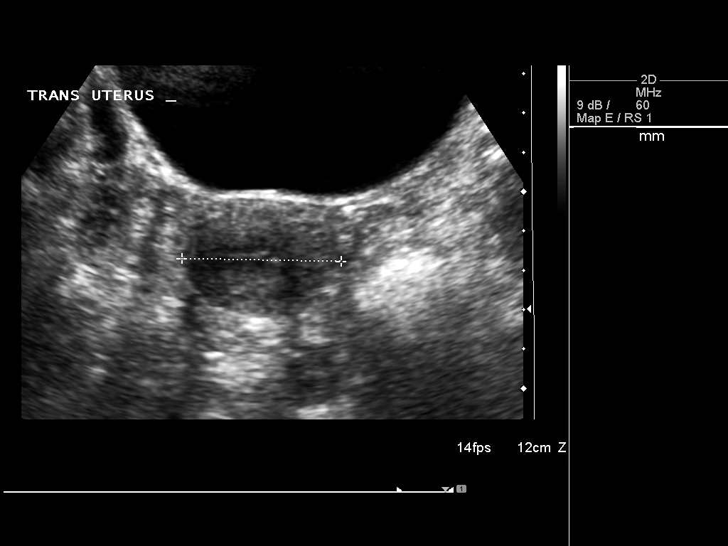
[im 13/51]
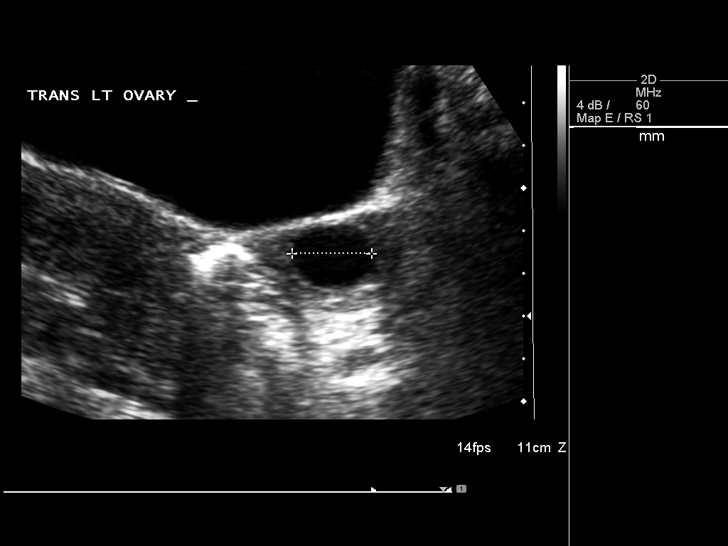
[im 17/51]
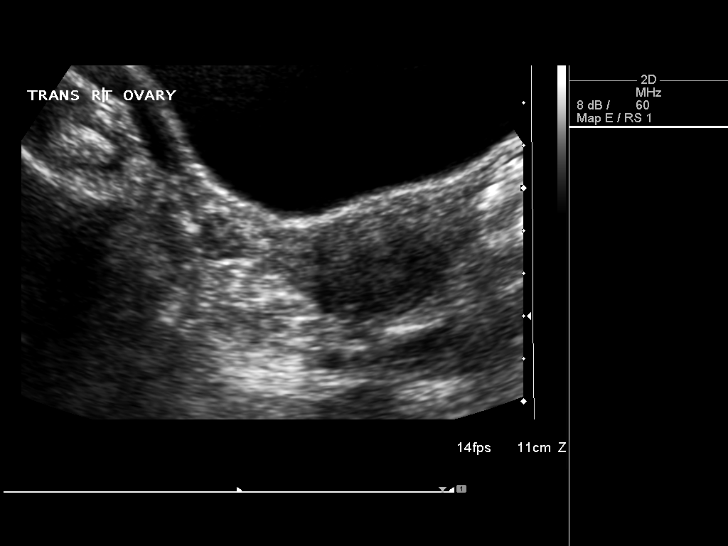
[im 21/51]
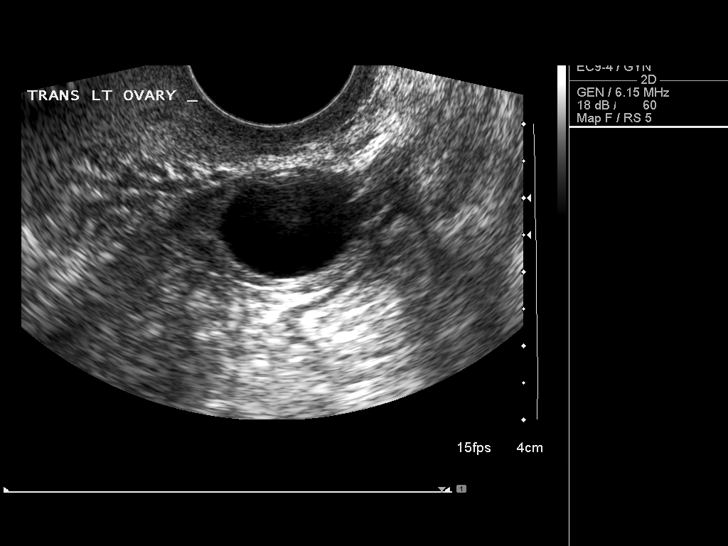
[im 26/51]
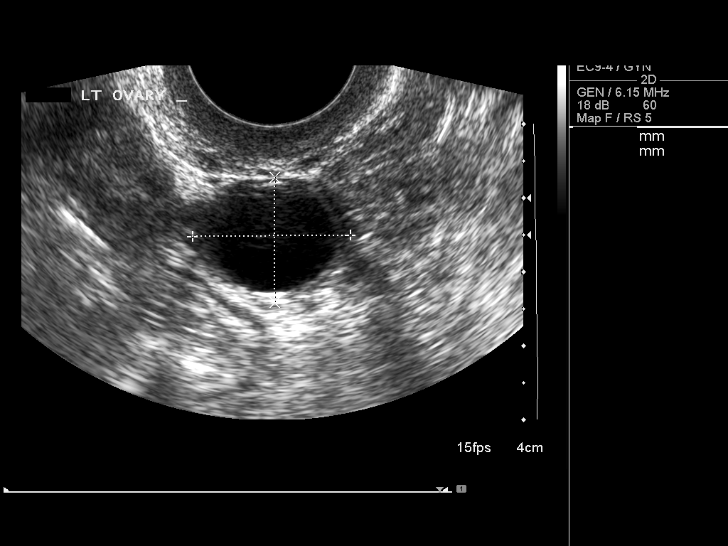
[im 30/51]
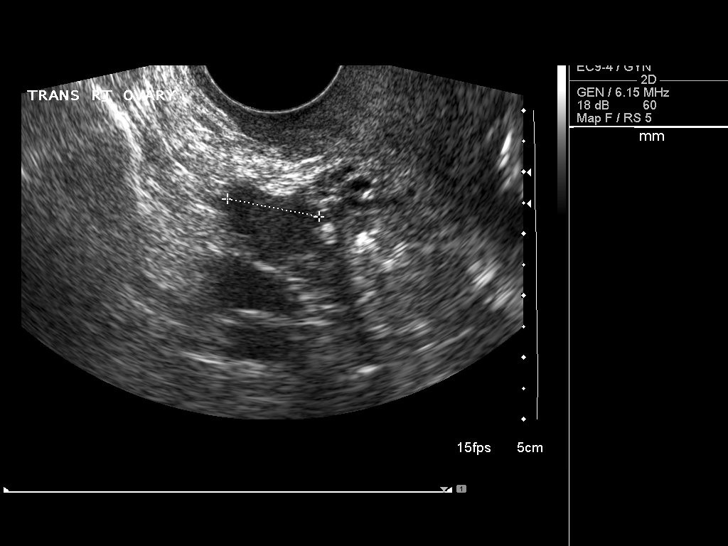
[im 34/51]
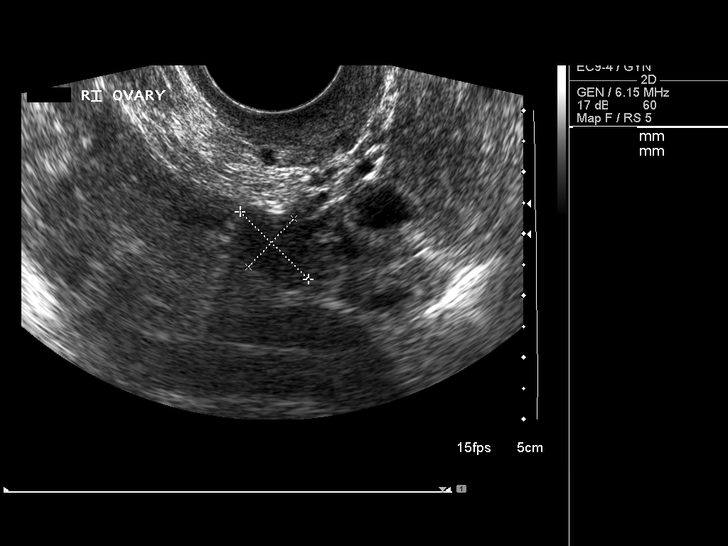
[im 38/51]
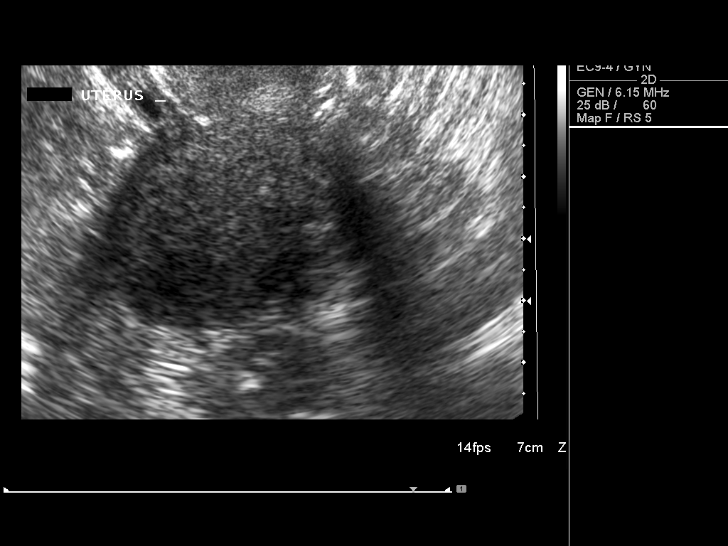
[im 42/51]
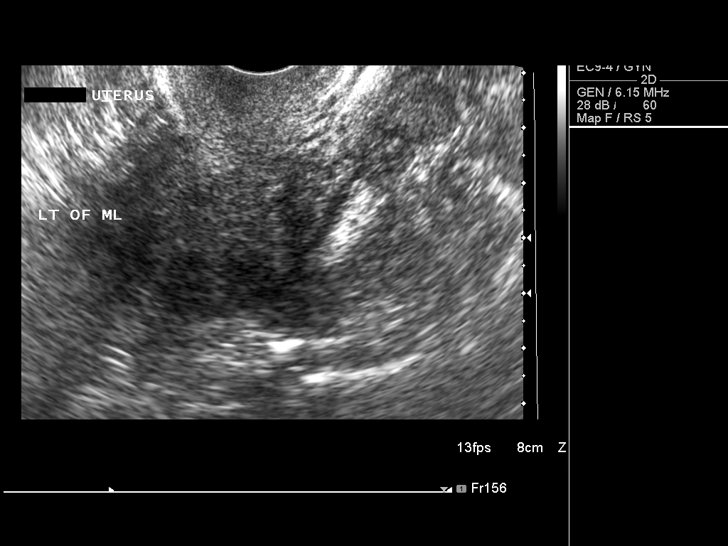
[im 46/51]
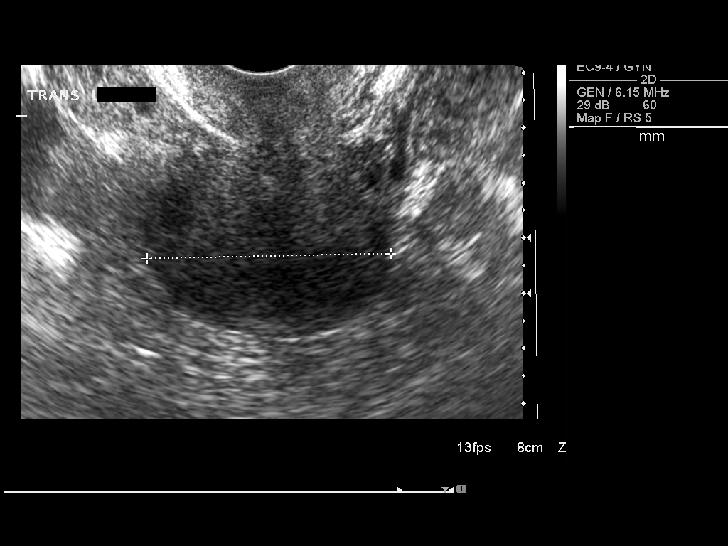
[im 51/51]
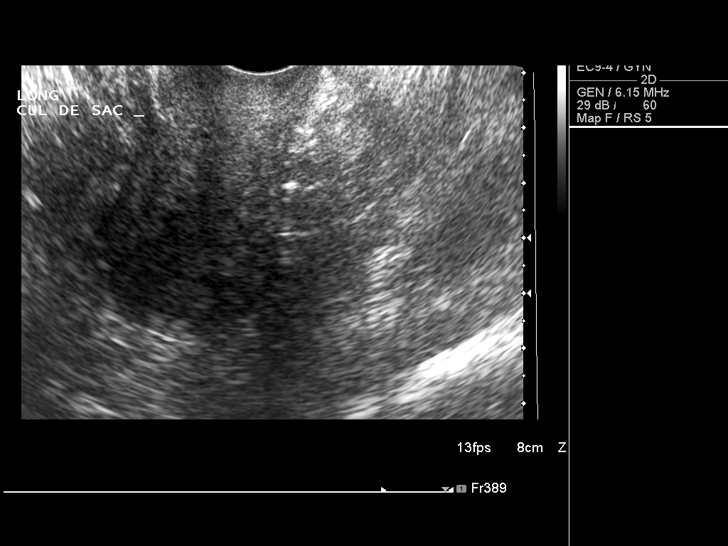

[13 of 25 positions shown; findings below may reference images not displayed]

FINDINGS: Uterus

Measurements: 6.2 x 3.0 x 4.1 cm. The myometrial echotexture is
heterogeneous. To the left of midline in the fundus but there is a
subtle hypoechoic region measuring 1.1 x 1.0 x 1.2 cm which may
reflect a fibroid.

Endometrium

Thickness: 4.6 mm.  No focal abnormality visualized.

Right ovary

Measurements: 1.6 x 1.1 x 1.5 cm. Normal appearance/no adnexal mass.

Left ovary

Measurements: 2.1 x 1.7 x 2.7 cm. There is a simple appearing left
ovarian cyst measuring 1.5 x 1.2 x 1.8 cm..

Other findings

No abnormal free fluid.
IMPRESSION: 1. Mildly heterogeneous uterine echotexture. Left-sided fundal
fibroid measuring 1.2 cm in greatest dimension.
2. Normal endometrial stripe thickness at 4.6 mm.
3. Simple appearing left ovarian cyst measuring up to 1.8 cm in
diameter.
4. No free pelvic fluid.

## 2019-01-14 ENCOUNTER — Other Ambulatory Visit: Payer: Self-pay | Admitting: General Surgery

## 2019-01-14 DIAGNOSIS — K439 Ventral hernia without obstruction or gangrene: Secondary | ICD-10-CM

## 2019-01-30 ENCOUNTER — Ambulatory Visit
Admission: RE | Admit: 2019-01-30 | Discharge: 2019-01-30 | Disposition: A | Discharge status: Home / Self Care | Payer: BLUE CROSS/BLUE SHIELD | Source: Ambulatory Visit | Attending: General Surgery | Admitting: General Surgery

## 2019-01-30 ENCOUNTER — Other Ambulatory Visit: Payer: Self-pay

## 2019-01-30 DIAGNOSIS — K439 Ventral hernia without obstruction or gangrene: Secondary | ICD-10-CM

## 2019-05-24 ENCOUNTER — Ambulatory Visit
Admission: EM | Admit: 2019-05-24 | Discharge: 2019-05-24 | Disposition: A | Discharge status: Home / Self Care | Payer: 59 | Attending: Emergency Medicine | Admitting: Emergency Medicine

## 2019-05-24 ENCOUNTER — Other Ambulatory Visit: Payer: Self-pay

## 2019-05-24 ENCOUNTER — Ambulatory Visit (INDEPENDENT_AMBULATORY_CARE_PROVIDER_SITE_OTHER): Payer: 59

## 2019-05-24 DIAGNOSIS — Z87891 Personal history of nicotine dependence: Secondary | ICD-10-CM | POA: Diagnosis not present

## 2019-05-24 DIAGNOSIS — I10 Essential (primary) hypertension: Secondary | ICD-10-CM

## 2019-05-24 DIAGNOSIS — M546 Pain in thoracic spine: Secondary | ICD-10-CM

## 2019-05-24 DIAGNOSIS — J439 Emphysema, unspecified: Secondary | ICD-10-CM

## 2019-05-24 MED ORDER — ALBUTEROL SULFATE HFA 108 (90 BASE) MCG/ACT IN AERS: 2.0000 | INHALATION_SPRAY | RESPIRATORY_TRACT | 0 refills | Status: DC | PRN

## 2019-05-24 NOTE — Discharge Instructions (Addendum)
Important follow-up with your PCP regarding emphysema, likely due to underlying COPD from former smoking.  Use albuterol inhaler as needed for shortness of breath, wheezing, chest tightness. May continue Robaxin as needed for muscle spasms, would encourage you to try two 220 mg naproxen/aleve (OTC) twice daily with food for the next week for additional inflammatory relief.

## 2019-05-24 NOTE — ED Provider Notes (Signed)
[name]    CSN: [id number] Arrival date & time: 05/24/19  1249      History   Chief Complaint Chief Complaint  Patient presents with   Back Pain    HPI [name] is a 58 y.o. female with history of hypertension, former tobacco use (quit in April 2020) presenting for right-sided thoracic back pain that is worsened over the last week.  Patient taking muscle relaxers for spasm, provided by PCP, though states this is not been helping.  Patient does do a lot of heavy lifting, bending, twisting at work.  Denies inciting event, trauma to the area.  Patient does endorse worsening of DOE/SOB.  Patient denies inhaler use.  Denies known history of COPD.  Patient largely concerned that she may have pneumonia despite lack of fever, cough.   Past Medical History:  Diagnosis Date   Depression    Dyspnea on exertion    Enlarged heart    Hypertension    Palpitations     Patient Active Problem List   Diagnosis Date Noted   Essential hypertension 09/29/2015   Dyspnea on exertion 09/29/2015   Palpitations 09/29/2015    Past Surgical History:  Procedure Laterality Date   APPENDECTOMY     BREAST ENHANCEMENT SURGERY     CESAREAN SECTION     CHOLECYSTECTOMY     COSMETIC SURGERY      OB History   No obstetric history on file.      Home Medications    Prior to Admission medications   Medication Sig Start Date End Date Taking? Authorizing Provider  albuterol (VENTOLIN HFA) 108 (90 Base) MCG/ACT inhaler Inhale 2 puffs into the lungs every 4 (four) hours as needed for wheezing or shortness of breath. 05/24/19   Hall-Potvin, [place], PA-C  atenolol (TENORMIN) 50 MG tablet Take 50 mg by mouth daily.    [provider]  diclofenac (VOLTAREN) 75 MG EC tablet Take 75 mg by mouth 2 (two) times daily.    [provider]  methocarbamol (ROBAXIN) 500 MG tablet Take 1,000 mg by mouth at bedtime as needed for muscle spasms.    [provider]    Family History Family History  Problem Relation Age of Onset   Heart disease Mother    Stroke Mother    Hypertension Mother    Heart disease Father    Glaucoma Father    Healthy Sister    Healthy Brother    Healthy Brother    Healthy Sister     Social History Social History   Tobacco Use   Smoking status: Current Some Day Smoker    Last attempt to quit: 05/20/2004    Years since quitting: 15.0   Smokeless tobacco: Never Used   Tobacco comment: social smoking  Substance Use Topics   Alcohol use: Yes    Alcohol/week: 0.0 standard drinks   Drug use: No     Allergies   Codeine, Penicillins, and Sulfa drugs cross reactors   Review of Systems Review of Systems  Constitutional: Negative for fatigue and fever.  HENT: Negative for ear pain, sinus pain, sore throat and voice change.   Eyes: Negative for pain, redness and visual disturbance.  Respiratory: Positive for shortness of breath. Negative for cough.   Cardiovascular: Negative for chest pain and palpitations.  Gastrointestinal: Negative for abdominal pain, diarrhea and vomiting.  Musculoskeletal: Positive for back pain. Negative for arthralgias, gait problem, myalgias, neck pain and neck stiffness.  Skin: Negative for  rash and wound.  Neurological: Negative for syncope and headaches.     Physical Exam Triage Vital Signs ED Triage Vitals  Enc Vitals Group     BP      Pulse      Resp      Temp      Temp src      SpO2      Weight      Height      Head Circumference      Peak Flow      Pain Score      Pain Loc      Pain Edu?      Excl. in [place]?    No data found.  Updated Vital Signs BP (!) 159/87 (BP Location: Left Arm)    Pulse 70    Temp 97.9 F (36.6 C) (Oral)    Resp 18    SpO2 98%    Physical Exam Constitutional:      General: She is not in acute distress. HENT:     Head: Normocephalic and atraumatic.     Mouth/Throat:     Mouth: Mucous membranes are moist.       Pharynx: Oropharynx is clear.  Eyes:     General: No scleral icterus.    Pupils: Pupils are equal, round, and reactive to light.  Cardiovascular:     Rate and Rhythm: Normal rate and regular rhythm.     Heart sounds: No murmur. No gallop.   Pulmonary:     Effort: Pulmonary effort is normal. No respiratory distress.     Breath sounds: No wheezing.  Musculoskeletal: Normal range of motion.        General: No swelling.     Right lower leg: No edema.     Left lower leg: No edema.     Comments: Intrascapular thoracic spinous process tenderness with the right paraspinal tenderness.  No edema, deformity, scoliosis, bruising.  Skin:    Capillary Refill: Capillary refill takes less than 2 seconds.     Coloration: Skin is not jaundiced or pale.  Neurological:     General: No focal deficit present.     Mental Status: She is alert and oriented to person, place, and time.     Motor: No weakness.     Gait: Gait normal.     Deep Tendon Reflexes: Reflexes normal.      UC Treatments / Results  Labs (all labs ordered are listed, but only abnormal results are displayed) Labs Reviewed - No data to display  EKG   Radiology Dg Chest 2 View  Result Date: 05/24/2019 CLINICAL DATA:  Pain with inspiration and shortness of breath for 1 week. EXAM: CHEST - 2 VIEW COMPARISON:  PA and lateral chest 09/16/2015. FINDINGS: Lungs are clear. There is some emphysematous change present. Heart size is normal. Atherosclerosis noted. No acute or focal bony abnormality. IMPRESSION: No acute disease. Atherosclerosis. Emphysema. Electronically Signed   By: Drusilla Kanner M.D.   On: 05/24/2019 13:58    Procedures Procedures (including critical care time)  Medications Ordered in UC Medications - No data to display  Initial Impression / Assessment and Plan / UC Course  I have reviewed the triage vital signs and the nursing notes.  Pertinent labs & imaging results that were available during my care of  the patient were reviewed by me and considered in my medical decision making (see chart for details).     Chest x-ray done in office, reviewed  by me and radiology and compared to previous from 2017: Significant for emphysematous changes, atherosclerosis-no acute processes noted such as pneumonia, pleural effusion, pneumothorax.  Likely musculoskeletal etiology: We will treat with NSAID as outlined below, with patient continuing home Robaxin as needed for breakthrough pain/spasm.  Provided albuterol inhaler as patient does not currently have one for likely underlying COPD second to former tobacco use.  Instructed patient to use this during episodes of SOB/DOE.  Recommended patient follow-up with PCP regarding further work-up for likely COPD.  Return precautions discussed, patient verbalized understanding and is agreeable to plan. Final Clinical Impressions(s) / UC Diagnoses   Final diagnoses:  Pulmonary emphysema, unspecified emphysema type (HCC)  Acute right-sided thoracic back pain     Discharge Instructions     Important follow-up with your PCP regarding emphysema, likely due to underlying COPD from former smoking.  Use albuterol inhaler as needed for shortness of breath, wheezing, chest tightness. May continue Robaxin as needed for muscle spasms, would encourage you to try two 220 mg naproxen/aleve (OTC) twice daily with food for the next week for additional inflammatory relief.    ED Prescriptions    Medication Sig Dispense Auth. Provider   albuterol (VENTOLIN HFA) 108 (90 Base) MCG/ACT inhaler Inhale 2 puffs into the lungs every 4 (four) hours as needed for wheezing or shortness of breath. 18 g Hall-Potvin, [place], PA-C     PDMP not reviewed this encounter.   Hall-Potvin, [place], [place] 05/24/19 1604

## 2019-05-24 NOTE — ED Triage Notes (Signed)
Pt c/o upper center back pain x 1wk and getting worse. States has been lifting heavy things. States she feel the pain on breathing.

## 2019-05-25 ENCOUNTER — Telehealth: Payer: Self-pay | Admitting: Emergency Medicine

## 2019-05-25 NOTE — Telephone Encounter (Signed)
Left voicemail checking in on patient, and encouraged return call with any continuing questions or concerns.    

## 2019-07-16 ENCOUNTER — Other Ambulatory Visit: Payer: Self-pay

## 2019-07-16 ENCOUNTER — Ambulatory Visit
Admission: EM | Admit: 2019-07-16 | Discharge: 2019-07-16 | Disposition: A | Discharge status: Home / Self Care | Payer: 59 | Attending: Physician Assistant | Admitting: Physician Assistant

## 2019-07-16 ENCOUNTER — Ambulatory Visit (INDEPENDENT_AMBULATORY_CARE_PROVIDER_SITE_OTHER): Payer: 59

## 2019-07-16 DIAGNOSIS — W010XXA Fall on same level from slipping, tripping and stumbling without subsequent striking against object, initial encounter: Secondary | ICD-10-CM | POA: Diagnosis not present

## 2019-07-16 DIAGNOSIS — S62624A Displaced fracture of medial phalanx of right ring finger, initial encounter for closed fracture: Secondary | ICD-10-CM

## 2019-07-16 NOTE — Discharge Instructions (Addendum)
Xray with fracture to the right ring finger. Continue voltaren. Tylenol as needed for pain. Elevation, rest, finger splint for the next 6-8 weeks. Follow up with orthopedics if symptoms not improving.

## 2019-07-16 NOTE — ED Provider Notes (Signed)
[name]    CSN: [id number] Arrival date & time: 07/16/19  1010      History   Chief Complaint Chief Complaint  Patient presents with  . Hand Pain    HPI [name] is a 58 y.o. female.   58 year old female comes in for right hand pain after injury.  States tripped over her dogs, and supported her body with her fingers flexed.  Denies any head injury, loss of consciousness.  States has some soreness to the shoulder, though without swelling, contusion, decrease in range of motion.  She has had swelling and contusion to the right third and fourth digit with decrease in range of motion.  Has intermittent numbness, tingling.  Has not taken anything for the symptoms.     Past Medical History:  Diagnosis Date  . Depression   . Dyspnea on exertion   . Enlarged heart   . Hypertension   . Palpitations     Patient Active Problem List   Diagnosis Date Noted  . Essential hypertension 09/29/2015  . Dyspnea on exertion 09/29/2015  . Palpitations 09/29/2015    Past Surgical History:  Procedure Laterality Date  . APPENDECTOMY    . BREAST ENHANCEMENT SURGERY    . CESAREAN SECTION    . CHOLECYSTECTOMY    . COSMETIC SURGERY      OB History   No obstetric history on file.      Home Medications    Prior to Admission medications   Medication Sig Start Date End Date Taking? Authorizing Provider  albuterol (VENTOLIN HFA) 108 (90 Base) MCG/ACT inhaler Inhale 2 puffs into the lungs every 4 (four) hours as needed for wheezing or shortness of breath. 05/24/19   Hall-Potvin, [place], PA-C  atenolol (TENORMIN) 50 MG tablet Take 50 mg by mouth daily.    [provider]  diclofenac (VOLTAREN) 75 MG EC tablet Take 75 mg by mouth 2 (two) times daily.    [provider]  methocarbamol (ROBAXIN) 500 MG tablet Take 1,000 mg by mouth at bedtime as needed for muscle spasms.    [provider]    Family History Family History  Problem  Relation Age of Onset  . Heart disease Mother   . Stroke Mother   . Hypertension Mother   . Heart disease Father   . Glaucoma Father   . Healthy Sister   . Healthy Brother   . Healthy Brother   . Healthy Sister     Social History Social History   Tobacco Use  . Smoking status: Current Some Day Smoker    Last attempt to quit: 05/20/2004    Years since quitting: 15.1  . Smokeless tobacco: Never Used  . Tobacco comment: social smoking  Substance Use Topics  . Alcohol use: Yes    Alcohol/week: 0.0 standard drinks  . Drug use: No     Allergies   Codeine, Penicillins, and Sulfa drugs cross reactors   Review of Systems Review of Systems  Reason unable to perform ROS: See HPI as above.     Physical Exam Triage Vital Signs ED Triage Vitals [07/16/19 1055]  Enc Vitals Group     BP (!) 146/91     Pulse Rate 64     Resp 18     Temp 98.3 F (36.8 C)     Temp Source Oral     SpO2 98 %     Weight      Height  Head Circumference      Peak Flow      Pain Score 5     Pain Loc      Pain Edu?      Excl. in [place]?    No data found.  Updated Vital Signs BP (!) 146/91 (BP Location: Left Arm)   Pulse 64   Temp 98.3 F (36.8 C) (Oral)   Resp 18   SpO2 98%    Physical Exam Constitutional:      General: She is not in acute distress.    Appearance: She is well-developed. She is not diaphoretic.  HENT:     Head: Normocephalic and atraumatic.  Eyes:     Conjunctiva/sclera: Conjunctivae normal.     Pupils: Pupils are equal, round, and reactive to light.  Pulmonary:     Effort: Pulmonary effort is normal. No respiratory distress.  Musculoskeletal:     Comments: Patient declined exam of neck, shoulder, elbow.    Swelling and contusion diffusely to the third and fourth digit of right hand.  No tenderness to palpation of the wrist.  No tenderness to palpation of the MCPs.  Diffuse tenderness to palpation of fourth digit.  Decreased flexion due to pain and swelling.   Sensation intact and equal bilaterally.  Radial pulse 2+, cap refill less than 2 seconds.  Skin:    General: Skin is warm and dry.  Neurological:     Mental Status: She is alert and oriented to person, place, and time.      UC Treatments / Results  Labs (all labs ordered are listed, but only abnormal results are displayed) Labs Reviewed - No data to display  EKG   Radiology Dg Hand Complete Right  Result Date: 07/16/2019 CLINICAL DATA:  Right hand pain post fall last night. EXAM: RIGHT HAND - COMPLETE 3+ VIEW COMPARISON:  None. FINDINGS: Mild degenerative changes over the radiocarpal joint and radial side of the carpal bones. There is a small minimally displaced chip fracture along the volar base of the fourth middle phalanx. Remainder of the exam is unremarkable. IMPRESSION: Small minimally displaced acute chip fracture involving the dorsal base of the fourth middle phalanx. Electronically Signed   By: Marin Olp M.D.   On: 07/16/2019 11:13    Procedures Procedures (including critical care time)  Medications Ordered in UC Medications - No data to display  Initial Impression / Assessment and Plan / UC Course  I have reviewed the triage vital signs and the nursing notes.  Pertinent labs & imaging results that were available during my care of the patient were reviewed by me and considered in my medical decision making (see chart for details).    Discussed x-ray results with patient.  Continue Voltaren, Tylenol as needed.  Elevation, rest, finger splint for the next 6 to 8 weeks.  Discussed to follow-up with orthopedic for further evaluation if symptoms not improving.  Return precautions given.  Patient expresses understanding and agrees to plan.  Final Clinical Impressions(s) / UC Diagnoses   Final diagnoses:  Closed displaced fracture of middle phalanx of right ring finger, initial encounter   ED Prescriptions    None     PDMP not reviewed this encounter.   Ok Edwards, PA-C 07/16/19 1426

## 2019-07-16 NOTE — ED Triage Notes (Signed)
Pt states tripped over her dogs last night, c/o rt hand pain. Bruising noted to rt 3 & 4th digit.

## 2019-07-20 ENCOUNTER — Other Ambulatory Visit: Payer: Self-pay

## 2019-07-20 ENCOUNTER — Ambulatory Visit (INDEPENDENT_AMBULATORY_CARE_PROVIDER_SITE_OTHER): Payer: 59 | Admitting: Critical Care Medicine

## 2019-07-20 ENCOUNTER — Encounter: Payer: Self-pay | Admitting: Critical Care Medicine

## 2019-07-20 VITALS — BP 138/88 | HR 66 | Temp 97.8°F | Ht 67.13 in | Wt 159.6 lb

## 2019-07-20 DIAGNOSIS — R0609 Other forms of dyspnea: Secondary | ICD-10-CM

## 2019-07-20 DIAGNOSIS — R0600 Dyspnea, unspecified: Secondary | ICD-10-CM | POA: Diagnosis not present

## 2019-07-20 DIAGNOSIS — Z148 Genetic carrier of other disease: Secondary | ICD-10-CM

## 2019-07-20 DIAGNOSIS — Z87891 Personal history of nicotine dependence: Secondary | ICD-10-CM | POA: Diagnosis not present

## 2019-07-20 DIAGNOSIS — Z23 Encounter for immunization: Secondary | ICD-10-CM | POA: Diagnosis not present

## 2019-07-20 MED ORDER — SPIRIVA RESPIMAT 2.5 MCG/ACT IN AERS: 1.0000 | INHALATION_SPRAY | Freq: Every day | RESPIRATORY_TRACT | 11 refills | Status: DC

## 2019-07-20 NOTE — Patient Instructions (Addendum)
Thank you for visiting Dr. Carlis Abbott at [facility].  We recommend the following: Orders Placed This Encounter  Procedures  . Pneumococcal conjugate vaccine 13-valent IM (Prevnar)  . Pulmonary function test   Orders Placed This Encounter  Procedures  . Pneumococcal conjugate vaccine 13-valent IM (Prevnar)  . Pulmonary function test    Standing Status:   Future    Standing Expiration Date:   07/19/2020    Order Specific Question:   Where should this test be performed?    Answer:   [facility]    Order Specific Question:   Full PFT: includes the following: basic spirometry, spirometry pre & post bronchodilator, diffusion capacity (DLCO), lung volumes    Answer:   Full PFT    Meds ordered this encounter  Medications  . Tiotropium Bromide Monohydrate (SPIRIVA RESPIMAT) 2.5 MCG/ACT AERS    Sig: Inhale 1 puff into the lungs daily.    Dispense:  4 g    Refill:  11    Return in about 2 months (around 09/20/2019). with Dr. Carlis Abbott.    Please do your part to reduce the spread of COVID-19.

## 2019-07-20 NOTE — Progress Notes (Addendum)
Synopsis: Referred in December 2020 for dyspnea on exertion by Farris Has, MD.  Subjective:   PATIENT ID: [name]: female DOB: 02/11/1961, MRN: [id number]  Chief Complaint  Patient presents with  . Consult    Patient is here for emphysema that was found on an xray in October. Patient states she has shortness of breath with exertion and with daily activites work, showering.    Mrs. [name] is a 58 year old woman who presents for evaluation of dyspnea on exertion.  She has a family history of alpha-1 antitrypsin causing COPD in her mother, never smoked.  Mother passed away last spring.  She reports that she is a carrier and her primary care physician has testing from 2017.  There is no family history of liver disease.  Her sister and brother also have COPD.  She has dyspnea on exertion for the past 1.5 to 2 years requiring her to walk much slower than before.  Her symptoms improved with rest.  She can walk as far she needs to if she walks slowly.  She rarely coughs for the last 4 to 5 months with only minimal occasional sputum.  She denies wheezing.  She has not tried the albuterol that was previously prescribed.  She quit smoking in April after smoking on and off for about 20 years x about 0.25 packs/day.  She had quit for as long as 7 years in the past.  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  She has no history of childhood asthma, but she had croup as a child, requiring hospitalization.  She has occasional GERD for which she takes Tums about once per week.  She has not yet had her seasonal flu or pneumonia shots.  In October she had a chest x-ray which demonstrated likely emphysema.     Past Medical History:  Diagnosis Date  . Depression   . Dyspnea on exertion   . Enlarged heart   . Hypertension   . Palpitations      Family History  Problem Relation Age of Onset  . Heart disease Mother   .  Stroke Mother   . Hypertension Mother   . COPD Mother        alpha 1 antitrypsin deficiency  . Heart disease Father   . Glaucoma Father   . COPD Sister   . COPD Brother   . Healthy Brother   . Healthy Sister      Past Surgical History:  Procedure Laterality Date  . APPENDECTOMY    . BREAST ENHANCEMENT SURGERY    . CESAREAN SECTION    . CHOLECYSTECTOMY    . COSMETIC SURGERY      Social History   Socioeconomic History  . Marital status: Divorced    Spouse name: Not on file  . Number of children: Not on file  . Years of education: Not on file  . Highest education level: Not on file  Occupational History  . Not on file  Social Needs  . Financial resource strain: Not on file  . Food insecurity    Worry: Not on file    Inability: Not on file  . Transportation needs    Medical: Not on file    Non-medical: Not on file  Tobacco Use  . Smoking status: Former Smoker    Packs/day: 0.25    Types: Cigarettes    Quit date: 11/19/2018  Years since quitting: 0.6  . Smokeless tobacco: Never Used  . Tobacco comment: social smoking  Substance and Sexual Activity  . Alcohol use: Yes    Comment: occasionally  . Drug use: No  . Sexual activity: Not on file  Lifestyle  . Physical activity    Days per week: Not on file    Minutes per session: Not on file  . Stress: Not on file  Relationships  . Social Musician on phone: Not on file    Gets together: Not on file    Attends religious service: Not on file    Active member of club or organization: Not on file    Attends meetings of clubs or organizations: Not on file    Relationship status: Not on file  . Intimate partner violence    Fear of current or ex partner: Not on file    Emotionally abused: Not on file    Physically abused: Not on file    Forced sexual activity: Not on file  Other Topics Concern  . Not on file  Social History Narrative  . Not on file     Allergies  Allergen Reactions  . Codeine  Nausea And Vomiting  . Penicillins Rash  . Sulfa Drugs Cross Reactors Rash     Immunization History  Administered Date(s) Administered  . Influenza,inj,Quad PF,6+ Mos 07/20/2019  . Pneumococcal Conjugate-13 07/20/2019    Outpatient Medications Prior to Visit  Medication Sig Dispense Refill  . atenolol (TENORMIN) 50 MG tablet Take 50 mg by mouth daily.    . diclofenac (VOLTAREN) 75 MG EC tablet Take 75 mg by mouth 2 (two) times daily.    . methocarbamol (ROBAXIN) 500 MG tablet Take 1,000 mg by mouth at bedtime as needed for muscle spasms.    Marland Kitchen albuterol (VENTOLIN HFA) 108 (90 Base) MCG/ACT inhaler Inhale 2 puffs into the lungs every 4 (four) hours as needed for wheezing or shortness of breath. (Patient not taking: Reported on 07/20/2019) 18 g 0   No facility-administered medications prior to visit.     Review of Systems  Constitutional: Negative for chills, fever and weight loss.  HENT: Negative for congestion.        No post nasal drip  Eyes: Negative.   Respiratory: Positive for cough and shortness of breath. Negative for sputum production and wheezing.   Cardiovascular: Negative for leg swelling.       CP when laying on R side  Gastrointestinal: Positive for heartburn. Negative for blood in stool, diarrhea, nausea and vomiting.  Genitourinary: Negative.   Musculoskeletal: Negative for joint pain.       Right hand injury from fall  Neurological: Positive for dizziness. Negative for focal weakness.     Objective:   Vitals:   07/20/19 0929  BP: 138/88  Pulse: 66  Temp: 97.8 F (36.6 C)  TempSrc: Temporal  SpO2: 96%  Weight: 159 lb 9.6 oz (72.4 kg)  Height: 5' 7.13" (1.705 m)   96% on   RA BMI Readings from Last 3 Encounters:  07/20/19 24.90 kg/m  11/30/15 20.27 kg/m  10/11/15 20.63 kg/m   Wt Readings from Last 3 Encounters:  07/20/19 159 lb 9.6 oz (72.4 kg)  11/30/15 121 lb 12.8 oz (55.2 kg)  10/11/15 124 lb (56.2 kg)    Physical Exam Vitals signs  reviewed.  Constitutional:      General: She is not in acute distress.    Appearance: Normal appearance. She  is not ill-appearing.  HENT:     Head: Normocephalic and atraumatic.     Nose:     Comments: Deferred due to masking requirement.    Mouth/Throat:     Comments: Deferred due to masking requirement. Eyes:     General: No scleral icterus. Neck:     Musculoskeletal: Neck supple.  Cardiovascular:     Rate and Rhythm: Normal rate and regular rhythm.     Heart sounds: No murmur.  Pulmonary:     Comments: Breathing comfortably on room air, no conversational dyspnea.  Clear to auscultation bilaterally. Abdominal:     General: There is no distension.     Palpations: Abdomen is soft.     Tenderness: There is no abdominal tenderness.  Musculoskeletal:     Comments: Right hand in soft cast. No LE edema, no clubbing.  Lymphadenopathy:     Cervical: No cervical adenopathy.  Skin:    General: Skin is warm and dry.     Findings: No rash.  Neurological:     General: No focal deficit present.     Mental Status: She is alert.     Coordination: Coordination normal.  Psychiatric:        Mood and Affect: Mood normal.        Behavior: Behavior normal.      CBC    Component Value Date/Time   WBC 5.4 09/16/2015 1457   RBC 4.57 09/16/2015 1457   HGB 13.8 09/16/2015 1457   HCT 40.4 09/16/2015 1457   PLT 236 09/16/2015 1457   MCV 88.4 09/16/2015 1457   MCV 89.6 05/20/2012 1144   MCH 30.2 09/16/2015 1457   MCHC 34.2 09/16/2015 1457   RDW 13.5 09/16/2015 1457     Chest Imaging- films reviewed: CXR, 2 view 05/24/2019-mild hyperinflation with increased retrosternal airspace  CT chest 03/21/2016-biapical scarring, normal scarring in medial RML and posterior LLL.  Pulmonary Functions Testing Results: No flowsheet data found.   Echocardiogram 09/20/2015: LVEF 55 to 95%, grade 1 diastolic dysfunction.  Normal LA, RV, RA.  Normal valves.  SPECT myocardial perfusion scan 10/11/2015:  Normal heart rate response to exercise with hypertensive response, no ST segment deviations or arrhythmias-low risk study.    Assessment & Plan:     ICD-10-CM   1. History of tobacco abuse  Z87.891 Pulmonary function test    Pneumococcal conjugate vaccine 13-valent IM (Prevnar)    Tiotropium Bromide Monohydrate (SPIRIVA RESPIMAT) 2.5 MCG/ACT AERS  2. DOE (dyspnea on exertion)  R06.00 Pulmonary function test    Pneumococcal conjugate vaccine 13-valent IM (Prevnar)    Tiotropium Bromide Monohydrate (SPIRIVA RESPIMAT) 2.5 MCG/ACT AERS  3. Carrier of alpha-1-antitrypsin deficiency  Z14.8 Pneumococcal conjugate vaccine 13-valent IM (Prevnar)    Tiotropium Bromide Monohydrate (SPIRIVA RESPIMAT) 2.5 MCG/ACT AERS  4. Need for immunization against influenza  Z23 Flu Vaccine QUAD 36+ mos IM     Dyspnea on exertion, very concerning for COPD given her history of tobacco abuse and family history of alpha-1 antitrypsin. -PFTs -Start Spiriva Respimat once daily.  Inhaler technique reviewed. -Albuterol as needed -She will ask her primary care physician to send [place] the records regarding her alpha-1 antitrypsin phenotype and level. -Congratulated her on her success with quitting smoking and encouraged ongoing cessation. -Prevnar 13 and seasonal flu vaccine today. -Continue handwashing, mask wearing, social distancing per COVID-19 recommendations.  Dizziness -Requested she discuss with her PCP for further evaluation  RTC in about 2 months after PFTs.   Current Outpatient  Medications:  .  atenolol (TENORMIN) 50 MG tablet, Take 50 mg by mouth daily., Disp: , Rfl:  .  diclofenac (VOLTAREN) 75 MG EC tablet, Take 75 mg by mouth 2 (two) times daily., Disp: , Rfl:  .  methocarbamol (ROBAXIN) 500 MG tablet, Take 1,000 mg by mouth at bedtime as needed for muscle spasms., Disp: , Rfl:  .  albuterol (VENTOLIN HFA) 108 (90 Base) MCG/ACT inhaler, Inhale 2 puffs into the lungs every 4 (four) hours as needed for  wheezing or shortness of breath. (Patient not taking: Reported on 07/20/2019), Disp: 18 g, Rfl: 0 .  Tiotropium Bromide Monohydrate (SPIRIVA RESPIMAT) 2.5 MCG/ACT AERS, Inhale 1 puff into the lungs daily., Disp: 4 g, Rfl: 11   Steffanie DunnLaura P Kordelia Severin, DO [facility] 07/20/2019 10:20 AM     Additional records obtained. Has one Z allele. No A1AT level has been drawn. Will order this.  Steffanie DunnLaura P Lucy Woolever, DO 07/21/19 5:10 PM [facility]

## 2019-07-21 NOTE — Addendum Note (Signed)
Addended by: Julian Hy on: 07/21/2019 05:10 PM   Modules accepted: Orders

## 2019-08-12 ENCOUNTER — Other Ambulatory Visit: Payer: 59

## 2019-08-12 DIAGNOSIS — Z148 Genetic carrier of other disease: Secondary | ICD-10-CM

## 2019-08-12 DIAGNOSIS — R0609 Other forms of dyspnea: Secondary | ICD-10-CM

## 2019-08-12 DIAGNOSIS — Z87891 Personal history of nicotine dependence: Secondary | ICD-10-CM

## 2019-08-13 LAB — ALPHA-1-ANTITRYPSIN: A-1 Antitrypsin, Ser: 73 mg/dL — ABNORMAL LOW (ref 83–199)

## 2019-08-18 ENCOUNTER — Other Ambulatory Visit: Payer: Self-pay | Admitting: Critical Care Medicine

## 2019-08-18 DIAGNOSIS — Z148 Genetic carrier of other disease: Secondary | ICD-10-CM

## 2019-08-28 DIAGNOSIS — M1812 Unilateral primary osteoarthritis of first carpometacarpal joint, left hand: Secondary | ICD-10-CM | POA: Diagnosis not present

## 2019-08-28 DIAGNOSIS — S62654D Nondisplaced fracture of medial phalanx of right ring finger, subsequent encounter for fracture with routine healing: Secondary | ICD-10-CM | POA: Diagnosis not present

## 2019-09-11 DIAGNOSIS — M1812 Unilateral primary osteoarthritis of first carpometacarpal joint, left hand: Secondary | ICD-10-CM | POA: Diagnosis not present

## 2019-09-11 DIAGNOSIS — S62654D Nondisplaced fracture of medial phalanx of right ring finger, subsequent encounter for fracture with routine healing: Secondary | ICD-10-CM | POA: Diagnosis not present

## 2019-09-12 ENCOUNTER — Other Ambulatory Visit (HOSPITAL_COMMUNITY)
Admission: RE | Admit: 2019-09-12 | Discharge: 2019-09-12 | Disposition: A | Discharge status: Home / Self Care | Payer: BC Managed Care – PPO | Source: Ambulatory Visit | Attending: Critical Care Medicine | Admitting: Critical Care Medicine

## 2019-09-12 DIAGNOSIS — Z20822 Contact with and (suspected) exposure to covid-19: Secondary | ICD-10-CM | POA: Diagnosis not present

## 2019-09-12 LAB — SARS CORONAVIRUS 2 (TAT 6-24 HRS): SARS Coronavirus 2: NEGATIVE

## 2019-09-16 ENCOUNTER — Ambulatory Visit (INDEPENDENT_AMBULATORY_CARE_PROVIDER_SITE_OTHER): Payer: BC Managed Care – PPO | Admitting: Critical Care Medicine

## 2019-09-16 ENCOUNTER — Other Ambulatory Visit: Payer: Self-pay

## 2019-09-16 ENCOUNTER — Encounter: Payer: Self-pay | Admitting: Critical Care Medicine

## 2019-09-16 VITALS — BP 102/70 | HR 87 | Temp 97.6°F | Ht 66.0 in | Wt 155.0 lb

## 2019-09-16 DIAGNOSIS — Z148 Genetic carrier of other disease: Secondary | ICD-10-CM

## 2019-09-16 DIAGNOSIS — Z87891 Personal history of nicotine dependence: Secondary | ICD-10-CM

## 2019-09-16 DIAGNOSIS — R0600 Dyspnea, unspecified: Secondary | ICD-10-CM

## 2019-09-16 DIAGNOSIS — R0609 Other forms of dyspnea: Secondary | ICD-10-CM

## 2019-09-16 LAB — PULMONARY FUNCTION TEST
DL/VA % pred: 96 %
DL/VA: 4.03 ml/min/mmHg/L
DLCO unc % pred: 88 %
DLCO unc: 19.45 ml/min/mmHg
FEF 25-75 Post: 3.18 L/sec
FEF 25-75 Pre: 2.67 L/sec
FEF2575-%Change-Post: 19 %
FEF2575-%Pred-Post: 122 %
FEF2575-%Pred-Pre: 102 %
FEV1-%Change-Post: 3 %
FEV1-%Pred-Post: 106 %
FEV1-%Pred-Pre: 102 %
FEV1-Post: 3.01 L
FEV1-Pre: 2.91 L
FEV1FVC-%Change-Post: 3 %
FEV1FVC-%Pred-Pre: 99 %
FEV6-%Change-Post: 0 %
FEV6-%Pred-Post: 105 %
FEV6-%Pred-Pre: 106 %
FEV6-Post: 3.71 L
FEV6-Pre: 3.73 L
FEV6FVC-%Pred-Post: 103 %
FEV6FVC-%Pred-Pre: 103 %
FVC-%Change-Post: 0 %
FVC-%Pred-Post: 102 %
FVC-%Pred-Pre: 102 %
FVC-Post: 3.71 L
FVC-Pre: 3.73 L
Post FEV1/FVC ratio: 81 %
Post FEV6/FVC ratio: 100 %
Pre FEV1/FVC ratio: 78 %
Pre FEV6/FVC Ratio: 100 %
RV % pred: 78 %
RV: 1.58 L
TLC % pred: 99 %
TLC: 5.33 L

## 2019-09-16 NOTE — Patient Instructions (Addendum)
Thank you for visiting Dr. Chestine Spore at [facility]. We recommend the following:  You need the Prevnar 13 pneumonia shot at some point after June 2021.    Return in about 1 year (around 09/15/2020).    Please do your part to reduce the spread of COVID-19.

## 2019-09-16 NOTE — Progress Notes (Signed)
Full PFT performed today. °

## 2019-09-16 NOTE — Progress Notes (Signed)
Synopsis: Referred in December 2020 for dyspnea on exertion by Farris Has, MD.  Subjective:   PATIENT ID: [name] GENDER: female DOB: 02/23/1961, MRN: [id number]  Chief Complaint  Patient presents with  . Follow-up    Patient is here for ROV with PFT. Patient has shortness of breath with the littlest thing she does. Patient states she has also started to have headaches in the morning when she wakes up and being lightheaded all the time. Patient states she is clearing her through all the time.    Mrs. Dagostino is a 59 year old who follows up for chronic dyspnea on exertion and concern for history of alpha-1 antitrypsin deficiency.  Her mother had alpha-1 antitrypsin deficiency and COPD.  She is a former smoker, off and on for 15 years x <0.5ppd before quitting in April 2020.  She continues to have dyspnea on exertion with any activity.  She has been referred for cardiac evaluation, but has not yet started this until her pulmonary evaluation is complete.   She has not been using inhalers; she was unable to fill the Spiriva inhaler due to cost. No significant cough, but has frequent throat clearing she attributes to dry weather.  No sinus drainage or heartburn.  No wheezing.     07/20/2019: Mrs. Seaberg is a 59 year old woman who presents for evaluation of dyspnea on exertion.  She has a family history of alpha-1 antitrypsin deficiency causing COPD in her mother, never smoked.  Mother passed away last spring.  She reports that she is a carrier and her primary care physician has testing from 2017.  There is no family history of liver disease.  Her sister and brother also have COPD.  She has dyspnea on exertion for the past 1.5 to 2 years requiring her to walk much slower than before.  Her symptoms improved with rest.  She can walk as far she needs to if she walks slowly.  She rarely coughs for the last 4 to 5 months with only minimal occasional sputum.  She denies wheezing.  She has not tried  the albuterol that was previously prescribed.  She quit smoking in April after smoking on and off for about 20 years x about 0.25 packs/day.  She had quit for as long as 7 years in the past.  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  She has no history of childhood asthma, but she had croup as a child, requiring hospitalization.  She has occasional GERD for which she takes Tums about once per week.  She has not yet had her seasonal flu or pneumonia shots.  In October she had a chest x-ray which demonstrated likely emphysema.   Past Medical History:  Diagnosis Date  . AAT (alpha-1-antitrypsin) deficiency (HCC)   . Depression   . Dyspnea on exertion   . Enlarged heart   . Hypertension   . Palpitations      Family History  Problem Relation Age of Onset  . Heart disease Mother   . Stroke Mother   . Hypertension Mother   . COPD Mother        alpha 1 antitrypsin deficiency  . Alpha-1 antitrypsin deficiency Mother        MZ  . Heart disease Father   . Glaucoma Father   . COPD Sister   . COPD Brother   . Healthy Brother   . Healthy Sister  Past Surgical History:  Procedure Laterality Date  . APPENDECTOMY    . BREAST ENHANCEMENT SURGERY    . CESAREAN SECTION    . CHOLECYSTECTOMY    . COSMETIC SURGERY      Social History   Socioeconomic History  . Marital status: Divorced    Spouse name: Not on file  . Number of children: Not on file  . Years of education: Not on file  . Highest education level: Not on file  Occupational History  . Not on file  Tobacco Use  . Smoking status: Former Smoker    Packs/day: 0.25    Types: Cigarettes    Quit date: 11/19/2018    Years since quitting: 0.8  . Smokeless tobacco: Never Used  . Tobacco comment: social smoking  Substance and Sexual Activity  . Alcohol use: Yes    Comment: occasionally  . Drug use: No  . Sexual activity: Not on file  Other Topics Concern    . Not on file  Social History Narrative  . Not on file   Social Determinants of Health   Financial Resource Strain:   . Difficulty of Paying Living Expenses: Not on file  Food Insecurity:   . Worried About Programme researcher, broadcasting/film/video in the Last Year: Not on file  . Ran Out of Food in the Last Year: Not on file  Transportation Needs:   . Lack of Transportation (Medical): Not on file  . Lack of Transportation (Non-Medical): Not on file  Physical Activity:   . Days of Exercise per Week: Not on file  . Minutes of Exercise per Session: Not on file  Stress:   . Feeling of Stress : Not on file  Social Connections:   . Frequency of Communication with Friends and Family: Not on file  . Frequency of Social Gatherings with Friends and Family: Not on file  . Attends Religious Services: Not on file  . Active Member of Clubs or Organizations: Not on file  . Attends Banker Meetings: Not on file  . Marital Status: Not on file  Intimate Partner Violence:   . Fear of Current or Ex-Partner: Not on file  . Emotionally Abused: Not on file  . Physically Abused: Not on file  . Sexually Abused: Not on file     Allergies  Allergen Reactions  . Codeine Nausea And Vomiting  . Penicillins Rash  . Sulfa Drugs Cross Reactors Rash     Immunization History  Administered Date(s) Administered  . Influenza,inj,Quad PF,6+ Mos 07/20/2019  . Pneumococcal Conjugate-13 07/20/2019    Outpatient Medications Prior to Visit  Medication Sig Dispense Refill  . albuterol (VENTOLIN HFA) 108 (90 Base) MCG/ACT inhaler Inhale 2 puffs into the lungs every 4 (four) hours as needed for wheezing or shortness of breath. 18 g 0  . atenolol (TENORMIN) 50 MG tablet Take 50 mg by mouth daily.    . diclofenac (VOLTAREN) 75 MG EC tablet Take 75 mg by mouth 2 (two) times daily.    . methocarbamol (ROBAXIN) 500 MG tablet Take 1,000 mg by mouth at bedtime as needed for muscle spasms.    . Tiotropium Bromide Monohydrate  (SPIRIVA RESPIMAT) 2.5 MCG/ACT AERS Inhale 1 puff into the lungs daily. (Patient not taking: Reported on 09/16/2019) 4 g 11   No facility-administered medications prior to visit.    Review of Systems  Constitutional: Negative for chills, fever and weight loss.  HENT: Negative for congestion.  No post nasal drip  Eyes: Negative.   Respiratory: Positive for cough and shortness of breath. Negative for sputum production and wheezing.   Cardiovascular: Negative for leg swelling.       CP when laying on R side  Gastrointestinal: Positive for heartburn. Negative for blood in stool, diarrhea, nausea and vomiting.  Genitourinary: Negative.   Musculoskeletal: Negative for joint pain.       Right hand injury from fall  Neurological: Positive for dizziness. Negative for focal weakness.     Objective:   Vitals:   09/16/19 1209  BP: 102/70  Pulse: 87  Temp: 97.6 F (36.4 C)  TempSrc: Temporal  SpO2: 96%  Weight: 155 lb (70.3 kg)  Height: 5\' 6"  (1.676 m)   96% on   RA BMI Readings from Last 3 Encounters:  09/16/19 25.02 kg/m  07/20/19 24.90 kg/m  11/30/15 20.27 kg/m   Wt Readings from Last 3 Encounters:  09/16/19 155 lb (70.3 kg)  07/20/19 159 lb 9.6 oz (72.4 kg)  11/30/15 121 lb 12.8 oz (55.2 kg)    Physical Exam Vitals reviewed.  Constitutional:      General: She is not in acute distress.    Appearance: She is not ill-appearing.  HENT:     Head: Normocephalic and atraumatic.     Nose:     Comments: Deferred due to masking requirement.    Mouth/Throat:     Comments: Deferred due to masking requirement. Eyes:     General: No scleral icterus. Cardiovascular:     Rate and Rhythm: Normal rate and regular rhythm.     Heart sounds: No murmur.  Pulmonary:     Comments: Eating comfortably on room air, no conversational dyspnea.  Occasional throat clearing.  Clear to auscultation bilaterally. Abdominal:     General: There is no distension.     Palpations: Abdomen is  soft.  Musculoskeletal:        General: No swelling or deformity.     Cervical back: Neck supple.  Lymphadenopathy:     Cervical: No cervical adenopathy.  Skin:    General: Skin is warm and dry.  Neurological:     General: No focal deficit present.     Mental Status: She is alert.     Motor: No weakness.     Coordination: Coordination normal.  Psychiatric:        Mood and Affect: Mood normal.        Behavior: Behavior normal.      CBC    Component Value Date/Time   WBC 5.4 09/16/2015 1457   RBC 4.57 09/16/2015 1457   HGB 13.8 09/16/2015 1457   HCT 40.4 09/16/2015 1457   PLT 236 09/16/2015 1457   MCV 88.4 09/16/2015 1457   MCV 89.6 05/20/2012 1144   MCH 30.2 09/16/2015 1457   MCHC 34.2 09/16/2015 1457   RDW 13.5 09/16/2015 1457     Chest Imaging- films reviewed: CXR, 2 view 05/24/2019-mild hyperinflation with increased retrosternal airspace  CT chest 03/21/2016-biapical scarring, normal scarring in medial RML and posterior LLL.  Pulmonary Functions Testing Results: PFT Results Latest Ref Rng & Units 09/16/2019  FVC-Pre L 3.73  FVC-Predicted Pre % 102  FVC-Post L 3.71  FVC-Predicted Post % 102  Pre FEV1/FVC % % 78  Post FEV1/FCV % % 81  FEV1-Pre L 2.91  FEV1-Predicted Pre % 102  FEV1-Post L 3.01  DLCO UNC% % 88  DLCO COR %Predicted % 96  TLC L 5.33  TLC %  Predicted % 99  RV % Predicted % 78   No significant obstruction or bronchodilator reversibility.  No restriction.  Normal diffusion.  Normal flow volume loop.  Echocardiogram 09/20/2015: LVEF 55 to 88%, grade 1 diastolic dysfunction.  Normal LA, RV, RA.  Normal valves.  SPECT myocardial perfusion scan 10/11/2015: Normal heart rate response to exercise with hypertensive response, no ST segment deviations or arrhythmias-low risk study.    Assessment & Plan:     ICD-10-CM   1. History of tobacco abuse  Z87.891   2. DOE (dyspnea on exertion)  R06.00   3. Carrier of alpha-1-antitrypsin deficiency  Z14.8       Chronic dyspnea on exertion.  PFTs not diagnostic of COPD or suggestive of asthma/struct of lung disease.  Carrier of alpha 1 antitrypsin mutation- PI*MZ with reduced total alpha-1 antitrypsin level.  High risk for progression to COPD given smoking history and low A1AT level. -No indication that an inhaler would benefit her symptoms.  -Has appointment tomorrow with [facility] alpha-1 antitrypsin clinic.  Her current levels do not suggest that she would benefit from supplemental therapy, but I welcome their input. -PFTs in 1 year given high risk of progression. -Recommend cardiac evaluation as prescribed by PCP given normal PFT does not explain dyspnea on exertion. -Congratulated her again on her success with quitting smoking and reinforced the importance of avoiding all inhalational smoking/vaping/drugs -Up-to-date on seasonal flu and pneumonia 23 vaccines.  Needs Prevnar 24 January 2020 or later.  Recommend Covid vaccine when it is available to her age group. -Continue Covid precautions-social distancing, mask wearing, handwashing.   RTC in 1 year or sooner as needed.   Current Outpatient Medications:  .  albuterol (VENTOLIN HFA) 108 (90 Base) MCG/ACT inhaler, Inhale 2 puffs into the lungs every 4 (four) hours as needed for wheezing or shortness of breath., Disp: 18 g, Rfl: 0 .  atenolol (TENORMIN) 50 MG tablet, Take 50 mg by mouth daily., Disp: , Rfl:  .  diclofenac (VOLTAREN) 75 MG EC tablet, Take 75 mg by mouth 2 (two) times daily., Disp: , Rfl:  .  methocarbamol (ROBAXIN) 500 MG tablet, Take 1,000 mg by mouth at bedtime as needed for muscle spasms., Disp: , Rfl:  .  Tiotropium Bromide Monohydrate (SPIRIVA RESPIMAT) 2.5 MCG/ACT AERS, Inhale 1 puff into the lungs daily. (Patient not taking: Reported on 09/16/2019), Disp: 4 g, Rfl: 11   Julian Hy, DO [facility] 09/16/2019 12:57 PM     Additional records obtained. Has one Z allele. No A1AT level has been drawn. Will  order this.  Julian Hy, DO 09/16/19 12:57 PM [facility]

## 2019-09-17 ENCOUNTER — Telehealth: Payer: Self-pay | Admitting: Critical Care Medicine

## 2019-09-17 NOTE — Telephone Encounter (Signed)
I called [name] but there was no answer. I left her a message to call me back.

## 2019-09-18 NOTE — Telephone Encounter (Signed)
I spoke with [name] and she stated she already received the PFT. Nothing further is needed.

## 2019-09-18 NOTE — Telephone Encounter (Signed)
I called Nancy but there was no answer. I left her a message to call me back.  

## 2019-09-25 ENCOUNTER — Encounter: Payer: Self-pay | Admitting: Family Medicine

## 2019-10-06 ENCOUNTER — Encounter: Payer: Self-pay | Admitting: Cardiovascular Disease

## 2019-10-06 ENCOUNTER — Other Ambulatory Visit: Payer: Self-pay

## 2019-10-06 ENCOUNTER — Ambulatory Visit (INDEPENDENT_AMBULATORY_CARE_PROVIDER_SITE_OTHER): Payer: BC Managed Care – PPO | Admitting: Cardiovascular Disease

## 2019-10-06 VITALS — BP 112/72 | HR 68 | Temp 98.4°F | Ht 65.5 in | Wt 160.0 lb

## 2019-10-06 DIAGNOSIS — I10 Essential (primary) hypertension: Secondary | ICD-10-CM

## 2019-10-06 DIAGNOSIS — R0602 Shortness of breath: Secondary | ICD-10-CM

## 2019-10-06 DIAGNOSIS — R0600 Dyspnea, unspecified: Secondary | ICD-10-CM | POA: Diagnosis not present

## 2019-10-06 DIAGNOSIS — Z01812 Encounter for preprocedural laboratory examination: Secondary | ICD-10-CM

## 2019-10-06 DIAGNOSIS — R0609 Other forms of dyspnea: Secondary | ICD-10-CM

## 2019-10-06 DIAGNOSIS — R002 Palpitations: Secondary | ICD-10-CM | POA: Diagnosis not present

## 2019-10-06 MED ORDER — METOPROLOL TARTRATE 100 MG PO TABS: ORAL_TABLET | ORAL | 0 refills | Status: DC

## 2019-10-06 NOTE — Assessment & Plan Note (Signed)
No longer an issue.

## 2019-10-06 NOTE — Patient Instructions (Addendum)
Medication Instructions:  Take 100mg  Metoprolol 2 hours prior to CTA.  If you need a refill on your cardiac medications before your next appointment, please call your pharmacy.   Lab work: BMET If you have labs (blood work) drawn today and your tests are completely normal, you will receive your results only by: MyChart Message (if you have MyChart) OR A paper copy in the mail If you have any lab test that is abnormal or we need to change your treatment, we will call you to review the results.  Testing/Procedures: Non-Cardiac CT scanning, (CAT scanning), is a noninvasive, special x-ray that produces cross-sectional images of the body using x-rays and a computer. CT scans help physicians diagnose and treat medical conditions. For some CT exams, a contrast material is used to enhance visibility in the area of the body being studied. CT scans provide greater clarity and reveal more details than regular x-ray exams.  AND  Your physician has requested that you have an echocardiogram. Echocardiography is a painless test that uses sound waves to create images of your heart. It provides your doctor with information about the size and shape of your heart and how well your heart's chambers and valves are working. This procedure takes approximately one hour. There are no restrictions for this procedure. [address]  Follow-Up: At [address], you and your health needs are our priority.  As part of our continuing mission to provide you with exceptional heart care, we have created designated Provider Care Teams.  These Care Teams include your primary Cardiologist (physician) and Advanced Practice Providers (APPs -  Physician Assistants and Nurse Practitioners) who all work together to provide you with the care you need, when you need it. You may see Dr. [employer] or one of the following Advanced Practice Providers on your designated Care Team:    Allyson Sabal, PA-C  [place],  [place]  [place], Edd Fabian  Your physician wants you to follow-up as needed  Any Other Special Instructions Will Be Listed Below (If Applicable).  Your cardiac CT will be scheduled at:   [facility] [address] [place], [place] [phone]   Please arrive at the [facility] main entrance of [facility] 30 minutes prior to test start time. Proceed to the [facility] Radiology Department (first floor) to check-in and test prep.  Please follow these instructions carefully (unless otherwise directed):  On the Night Before the Test: . Be sure to Drink plenty of water. . Do not consume any caffeinated/decaffeinated beverages or chocolate 12 hours prior to your test. . Do not take any antihistamines 12 hours prior to your test. . If you take Metformin do not take 24 hours prior to test.  On the Day of the Test: . Drink plenty of water. Do not drink any water within one hour of the test. . Do not eat any food 4 hours prior to the test. . You may take your regular medications prior to the test.  . Take 100mg  metoprolol (Lopressor) two hours prior to test. . HOLD Furosemide/Hydrochlorothiazide morning of the test. . FEMALES- please wear underwire-free bra if available      After the Test: . Drink plenty of water. . After receiving IV contrast, you may experience a mild flushed feeling. This is normal. . On occasion, you may experience a mild rash up to 24 hours after the test. This is not dangerous. If this occurs, you can take Benadryl  25 mg and increase your fluid intake. . If you experience trouble breathing, this can be serious. If it is severe call 911 IMMEDIATELY. If it is mild, please call our office. . If you take any of these medications: Glipizide/Metformin, Avandament, Glucavance, please do not take 48 hours after completing test unless otherwise instructed.   Once we have confirmed authorization from your insurance company, we will call  you to set up a date and time for your test.   For non-scheduling related questions, please contact the cardiac imaging nurse navigator should you have any questions/concerns: Marchia Bond, RN Navigator Cardiac Imaging [facility] [phone] mobile

## 2019-10-06 NOTE — Assessment & Plan Note (Signed)
History of dyspnea on exertion with COPD.  There is a question of alpha-1 antitrypsin disease.  Has been evaluated by alpha-1 antitrypsin clinic at [facility] apparently she had normal pulmonary function tests.  She has had a normal 2D echo in the past.  She does have risk factors for ischemic heart disease.  Go to get a 2D echo and a coronary CTA to further evaluate

## 2019-10-06 NOTE — Progress Notes (Signed)
10/06/2019 [name]   [birth year]  [id number]  Primary Physician Farris Has, MD Primary Cardiologist: Runell Gess MD [name], [place]  HPI:  [name] is a 59 y.o. thin appearing Caucasian female mother of 3 children (2 biologic) and one grandson who was initially referred by Dr. Ricci Barker at [facility] for evaluation of new onset hypertension, dyspnea fatigue and palpitations.  I last saw her in the office 09/29/2015.  Her cardiac risk factor profile is notable for 12 years of tobacco abuse having quit 7 years ago. She does drink 2-4 beers a night. Her father had CAD. She said new-onset hypertension last several weeks with ER visit for headache. She had negative head CT and a chest CT that was negative for pulmonary emboli. Coronary calcification were not mentioned. She is complaining of fatigue the last year and increasing dyspnea on exertion. Recent 2-D echo showed normal LV function, mild LVH with grade 1 diastolic dysfunction.  A Myoview stress test was performed at the same time which was nonischemic.  Event monitor showed occasional PACs.  Since I saw her 4 years ago she has smoked off and on but now has been smoke-free for a year.  She stopped drinking as well.  All her children are grown and out of the house, and she lives alone.  She says over the last several months she has had more dyspnea on exertion when going out to her barn.  She denies chest pain.   Current Meds  Medication Sig  . atenolol (TENORMIN) 50 MG tablet Take 50 mg by mouth daily.  . diclofenac (VOLTAREN) 75 MG EC tablet Take 75 mg by mouth 2 (two) times daily.  . methocarbamol (ROBAXIN) 500 MG tablet Take 1,000 mg by mouth at bedtime as needed for muscle spasms.  . [DISCONTINUED] albuterol (VENTOLIN HFA) 108 (90 Base) MCG/ACT inhaler Inhale 2 puffs into the lungs every 4 (four) hours as needed for wheezing or shortness of breath.  . [DISCONTINUED] Tiotropium Bromide Monohydrate (SPIRIVA RESPIMAT)  2.5 MCG/ACT AERS Inhale 1 puff into the lungs daily.     Allergies  Allergen Reactions  . Codeine Nausea And Vomiting  . Penicillins Rash  . Sulfa Drugs Cross Reactors Rash    Social History   Socioeconomic History  . Marital status: Divorced    Spouse name: Not on file  . Number of children: Not on file  . Years of education: Not on file  . Highest education level: Not on file  Occupational History  . Not on file  Tobacco Use  . Smoking status: Former Smoker    Packs/day: 0.25    Types: Cigarettes    Quit date: 11/19/2018    Years since quitting: 0.8  . Smokeless tobacco: Never Used  . Tobacco comment: social smoking  Substance and Sexual Activity  . Alcohol use: Yes    Comment: occasionally  . Drug use: No  . Sexual activity: Not on file  Other Topics Concern  . Not on file  Social History Narrative  . Not on file   Social Determinants of Health   Financial Resource Strain:   . Difficulty of Paying Living Expenses: Not on file  Food Insecurity:   . Worried About Programme researcher, broadcasting/film/video in the Last Year: Not on file  . Ran Out of Food in the Last Year: Not on file  Transportation Needs:   . Lack of Transportation (Medical): Not on file  . Lack of Transportation (Non-Medical):  Not on file  Physical Activity:   . Days of Exercise per Week: Not on file  . Minutes of Exercise per Session: Not on file  Stress:   . Feeling of Stress : Not on file  Social Connections:   . Frequency of Communication with Friends and Family: Not on file  . Frequency of Social Gatherings with Friends and Family: Not on file  . Attends Religious Services: Not on file  . Active Member of Clubs or Organizations: Not on file  . Attends Archivist Meetings: Not on file  . Marital Status: Not on file  Intimate Partner Violence:   . Fear of Current or Ex-Partner: Not on file  . Emotionally Abused: Not on file  . Physically Abused: Not on file  . Sexually Abused: Not on file      Review of Systems: General: negative for chills, fever, night sweats or weight changes.  Cardiovascular: negative for chest pain, dyspnea on exertion, edema, orthopnea, palpitations, paroxysmal nocturnal dyspnea or shortness of breath Dermatological: negative for rash Respiratory: negative for cough or wheezing Urologic: negative for hematuria Abdominal: negative for nausea, vomiting, diarrhea, bright red blood per rectum, melena, or hematemesis Neurologic: negative for visual changes, syncope, or dizziness All other systems reviewed and are otherwise negative except as noted above.    Blood pressure 112/72, pulse 68, temperature 98.4 F (36.9 C), height 5' 5.5" (1.664 m), weight 160 lb (72.6 kg).  General appearance: alert and no distress Neck: no adenopathy, no carotid bruit, no JVD, supple, symmetrical, trachea midline and thyroid not enlarged, symmetric, no tenderness/mass/nodules Lungs: clear to auscultation bilaterally Heart: regular rate and rhythm, S1, S2 normal, no murmur, click, rub or gallop Extremities: extremities normal, atraumatic, no cyanosis or edema Pulses: 2+ and symmetric Skin: Skin color, texture, turgor normal. No rashes or lesions Neurologic: Alert and oriented X 3, normal strength and tone. Normal symmetric reflexes. Normal coordination and gait  EKG sinus rhythm at 68 without ST or T wave changes.  I personally reviewed this EKG.  ASSESSMENT AND PLAN:   Essential hypertension History of essential hypertension blood pressure measured today at 112/72.  She is on atenolol  Dyspnea on exertion History of dyspnea on exertion with COPD.  There is a question of alpha-1 antitrypsin disease.  Has been evaluated by alpha-1 antitrypsin clinic at [facility] apparently she had normal pulmonary function tests.  She has had a normal 2D echo in the past.  She does have risk factors for ischemic heart disease.  Go to get a 2D echo and a coronary CTA to further  evaluate  Palpitations No longer an issue      Lorretta Harp MD [facility], [facility] 10/06/2019 11:47 AM

## 2019-10-06 NOTE — Assessment & Plan Note (Signed)
History of essential hypertension blood pressure measured today at 112/72.  She is on atenolol

## 2019-10-07 LAB — BASIC METABOLIC PANEL
BUN/Creatinine Ratio: 19 (ref 9–23)
BUN: 15 mg/dL (ref 6–24)
CO2: 25 mmol/L (ref 20–29)
Calcium: 9.3 mg/dL (ref 8.7–10.2)
Chloride: 100 mmol/L (ref 96–106)
Creatinine, Ser: 0.79 mg/dL (ref 0.57–1.00)
GFR calc Af Amer: 96 mL/min/{1.73_m2} (ref >59)
GFR calc non Af Amer: 83 mL/min/{1.73_m2} (ref >59)
Glucose: 76 mg/dL (ref 65–99)
Potassium: 5.4 mmol/L — ABNORMAL HIGH (ref 3.5–5.2)
Sodium: 138 mmol/L (ref 134–144)

## 2019-10-20 ENCOUNTER — Other Ambulatory Visit: Payer: Self-pay

## 2019-10-20 ENCOUNTER — Ambulatory Visit (HOSPITAL_COMMUNITY): Payer: BC Managed Care – PPO | Attending: Cardiology

## 2019-10-20 DIAGNOSIS — R0609 Other forms of dyspnea: Secondary | ICD-10-CM

## 2019-10-20 DIAGNOSIS — R0602 Shortness of breath: Secondary | ICD-10-CM | POA: Diagnosis not present

## 2019-10-20 DIAGNOSIS — R0600 Dyspnea, unspecified: Secondary | ICD-10-CM | POA: Insufficient documentation

## 2019-10-29 DIAGNOSIS — Z13220 Encounter for screening for lipoid disorders: Secondary | ICD-10-CM | POA: Diagnosis not present

## 2019-10-29 DIAGNOSIS — I10 Essential (primary) hypertension: Secondary | ICD-10-CM | POA: Diagnosis not present

## 2019-10-29 DIAGNOSIS — M5430 Sciatica, unspecified side: Secondary | ICD-10-CM | POA: Diagnosis not present

## 2019-10-29 DIAGNOSIS — R0602 Shortness of breath: Secondary | ICD-10-CM | POA: Diagnosis not present

## 2019-11-16 ENCOUNTER — Telehealth (HOSPITAL_COMMUNITY): Payer: Self-pay | Admitting: Emergency Medicine

## 2019-11-16 ENCOUNTER — Encounter (HOSPITAL_COMMUNITY): Payer: Self-pay

## 2019-11-16 NOTE — Telephone Encounter (Signed)
Reaching out to patient to offer assistance regarding upcoming cardiac imaging study; pt verbalizes understanding of appt date/time, parking situation and where to check in, pre-test NPO status and medications ordered, and verified current allergies; name and call back number provided for further questions should they arise Carrie Hartel RN Navigator Cardiac Imaging [facility] [phone] office [phone] cell 

## 2019-11-17 ENCOUNTER — Ambulatory Visit (HOSPITAL_COMMUNITY)
Admission: RE | Admit: 2019-11-17 | Discharge: 2019-11-17 | Disposition: A | Discharge status: Home / Self Care | Payer: BC Managed Care – PPO | Source: Ambulatory Visit | Attending: Cardiovascular Disease | Admitting: Cardiovascular Disease

## 2019-11-17 ENCOUNTER — Encounter (HOSPITAL_COMMUNITY): Payer: Self-pay

## 2019-11-17 ENCOUNTER — Other Ambulatory Visit: Payer: Self-pay

## 2019-11-17 DIAGNOSIS — R0602 Shortness of breath: Secondary | ICD-10-CM | POA: Diagnosis not present

## 2019-11-17 MED ORDER — IOHEXOL 350 MG/ML SOLN
80.0000 mL | Freq: Once | INTRAVENOUS | Status: AC | PRN
  Administered 2019-11-17: 13:00:00 80 mL via INTRAVENOUS

## 2019-11-17 MED ORDER — NITROGLYCERIN 0.4 MG SL SUBL
SUBLINGUAL_TABLET | SUBLINGUAL | Status: AC
  Filled 2019-11-17: qty 2

## 2019-11-17 MED ORDER — NITROGLYCERIN 0.4 MG SL SUBL
0.8000 mg | SUBLINGUAL_TABLET | Freq: Once | SUBLINGUAL | Status: AC
  Administered 2019-11-17: 13:00:00 0.8 mg via SUBLINGUAL

## 2019-12-24 DIAGNOSIS — Z6825 Body mass index (BMI) 25.0-25.9, adult: Secondary | ICD-10-CM | POA: Diagnosis not present

## 2019-12-24 DIAGNOSIS — Z20822 Contact with and (suspected) exposure to covid-19: Secondary | ICD-10-CM | POA: Diagnosis not present

## 2020-01-07 DIAGNOSIS — R197 Diarrhea, unspecified: Secondary | ICD-10-CM | POA: Diagnosis not present

## 2020-01-07 DIAGNOSIS — K1379 Other lesions of oral mucosa: Secondary | ICD-10-CM | POA: Diagnosis not present

## 2020-01-07 DIAGNOSIS — M2550 Pain in unspecified joint: Secondary | ICD-10-CM | POA: Diagnosis not present

## 2020-01-20 DIAGNOSIS — M79641 Pain in right hand: Secondary | ICD-10-CM | POA: Diagnosis not present

## 2020-01-20 DIAGNOSIS — M19042 Primary osteoarthritis, left hand: Secondary | ICD-10-CM | POA: Diagnosis not present

## 2020-01-20 DIAGNOSIS — M359 Systemic involvement of connective tissue, unspecified: Secondary | ICD-10-CM | POA: Diagnosis not present

## 2020-01-20 DIAGNOSIS — M791 Myalgia, unspecified site: Secondary | ICD-10-CM | POA: Diagnosis not present

## 2020-01-20 DIAGNOSIS — M79642 Pain in left hand: Secondary | ICD-10-CM | POA: Diagnosis not present

## 2020-01-20 DIAGNOSIS — M549 Dorsalgia, unspecified: Secondary | ICD-10-CM | POA: Diagnosis not present

## 2020-01-20 DIAGNOSIS — M1811 Unilateral primary osteoarthritis of first carpometacarpal joint, right hand: Secondary | ICD-10-CM | POA: Diagnosis not present

## 2020-01-20 DIAGNOSIS — M79643 Pain in unspecified hand: Secondary | ICD-10-CM | POA: Diagnosis not present

## 2020-01-20 DIAGNOSIS — M1711 Unilateral primary osteoarthritis, right knee: Secondary | ICD-10-CM | POA: Diagnosis not present

## 2020-01-20 DIAGNOSIS — M2550 Pain in unspecified joint: Secondary | ICD-10-CM | POA: Diagnosis not present

## 2020-01-20 DIAGNOSIS — M79672 Pain in left foot: Secondary | ICD-10-CM | POA: Diagnosis not present

## 2020-01-20 DIAGNOSIS — J984 Other disorders of lung: Secondary | ICD-10-CM | POA: Diagnosis not present

## 2020-01-20 DIAGNOSIS — M79671 Pain in right foot: Secondary | ICD-10-CM | POA: Diagnosis not present

## 2020-01-20 DIAGNOSIS — M533 Sacrococcygeal disorders, not elsewhere classified: Secondary | ICD-10-CM | POA: Diagnosis not present

## 2020-01-20 DIAGNOSIS — M1812 Unilateral primary osteoarthritis of first carpometacarpal joint, left hand: Secondary | ICD-10-CM | POA: Diagnosis not present

## 2020-01-20 DIAGNOSIS — M545 Low back pain: Secondary | ICD-10-CM | POA: Diagnosis not present

## 2020-01-20 DIAGNOSIS — M19041 Primary osteoarthritis, right hand: Secondary | ICD-10-CM | POA: Diagnosis not present

## 2020-01-20 DIAGNOSIS — M1712 Unilateral primary osteoarthritis, left knee: Secondary | ICD-10-CM | POA: Diagnosis not present

## 2020-02-18 DIAGNOSIS — M2550 Pain in unspecified joint: Secondary | ICD-10-CM | POA: Diagnosis not present

## 2020-02-18 DIAGNOSIS — M79643 Pain in unspecified hand: Secondary | ICD-10-CM | POA: Diagnosis not present

## 2020-02-18 DIAGNOSIS — M549 Dorsalgia, unspecified: Secondary | ICD-10-CM | POA: Diagnosis not present

## 2020-02-18 DIAGNOSIS — Z23 Encounter for immunization: Secondary | ICD-10-CM | POA: Diagnosis not present

## 2020-02-18 DIAGNOSIS — M791 Myalgia, unspecified site: Secondary | ICD-10-CM | POA: Diagnosis not present

## 2020-03-30 ENCOUNTER — Ambulatory Visit: Payer: BC Managed Care – PPO | Admitting: Rheumatology

## 2020-04-22 DIAGNOSIS — M79643 Pain in unspecified hand: Secondary | ICD-10-CM | POA: Diagnosis not present

## 2020-04-22 DIAGNOSIS — M2550 Pain in unspecified joint: Secondary | ICD-10-CM | POA: Diagnosis not present

## 2020-04-22 DIAGNOSIS — M791 Myalgia, unspecified site: Secondary | ICD-10-CM | POA: Diagnosis not present

## 2020-04-22 DIAGNOSIS — M549 Dorsalgia, unspecified: Secondary | ICD-10-CM | POA: Diagnosis not present

## 2020-04-28 ENCOUNTER — Other Ambulatory Visit: Payer: Self-pay | Admitting: Family Medicine

## 2020-04-28 DIAGNOSIS — Z1231 Encounter for screening mammogram for malignant neoplasm of breast: Secondary | ICD-10-CM

## 2020-05-02 ENCOUNTER — Ambulatory Visit: Payer: BC Managed Care – PPO | Admitting: Rheumatology

## 2020-05-25 ENCOUNTER — Other Ambulatory Visit: Payer: Self-pay

## 2020-05-25 ENCOUNTER — Ambulatory Visit
Admission: RE | Admit: 2020-05-25 | Discharge: 2020-05-25 | Disposition: A | Discharge status: Home / Self Care | Payer: BC Managed Care – PPO | Source: Ambulatory Visit | Attending: Family Medicine | Admitting: Family Medicine

## 2020-05-25 DIAGNOSIS — Z1231 Encounter for screening mammogram for malignant neoplasm of breast: Secondary | ICD-10-CM | POA: Diagnosis not present

## 2020-06-22 DIAGNOSIS — M549 Dorsalgia, unspecified: Secondary | ICD-10-CM | POA: Diagnosis not present

## 2020-06-22 DIAGNOSIS — M3500 Sicca syndrome, unspecified: Secondary | ICD-10-CM | POA: Diagnosis not present

## 2020-06-22 DIAGNOSIS — M79643 Pain in unspecified hand: Secondary | ICD-10-CM | POA: Diagnosis not present

## 2020-06-22 DIAGNOSIS — M791 Myalgia, unspecified site: Secondary | ICD-10-CM | POA: Diagnosis not present

## 2020-07-12 DIAGNOSIS — I10 Essential (primary) hypertension: Secondary | ICD-10-CM | POA: Diagnosis not present

## 2020-07-12 DIAGNOSIS — M3500 Sicca syndrome, unspecified: Secondary | ICD-10-CM | POA: Diagnosis not present

## 2020-07-12 DIAGNOSIS — M2550 Pain in unspecified joint: Secondary | ICD-10-CM | POA: Diagnosis not present

## 2020-07-12 DIAGNOSIS — M069 Rheumatoid arthritis, unspecified: Secondary | ICD-10-CM | POA: Diagnosis not present

## 2020-07-15 DIAGNOSIS — M25561 Pain in right knee: Secondary | ICD-10-CM | POA: Diagnosis not present

## 2020-07-15 DIAGNOSIS — M25552 Pain in left hip: Secondary | ICD-10-CM | POA: Diagnosis not present

## 2020-07-15 DIAGNOSIS — M4316 Spondylolisthesis, lumbar region: Secondary | ICD-10-CM | POA: Diagnosis not present

## 2020-07-20 DIAGNOSIS — Z79899 Other long term (current) drug therapy: Secondary | ICD-10-CM | POA: Diagnosis not present

## 2020-07-20 DIAGNOSIS — M0609 Rheumatoid arthritis without rheumatoid factor, multiple sites: Secondary | ICD-10-CM | POA: Diagnosis not present

## 2020-07-25 DIAGNOSIS — I10 Essential (primary) hypertension: Secondary | ICD-10-CM | POA: Diagnosis not present

## 2020-07-25 DIAGNOSIS — M069 Rheumatoid arthritis, unspecified: Secondary | ICD-10-CM | POA: Diagnosis not present

## 2020-08-09 ENCOUNTER — Emergency Department (HOSPITAL_COMMUNITY): Payer: BC Managed Care – PPO

## 2020-08-09 ENCOUNTER — Encounter (HOSPITAL_COMMUNITY): Payer: Self-pay

## 2020-08-09 ENCOUNTER — Emergency Department (HOSPITAL_COMMUNITY)
Admission: EM | Admit: 2020-08-09 | Discharge: 2020-08-10 | Disposition: A | Discharge status: Home / Self Care | Payer: BC Managed Care – PPO | Attending: Emergency Medicine | Admitting: Emergency Medicine

## 2020-08-09 ENCOUNTER — Other Ambulatory Visit: Payer: Self-pay

## 2020-08-09 DIAGNOSIS — R410 Disorientation, unspecified: Secondary | ICD-10-CM | POA: Diagnosis not present

## 2020-08-09 DIAGNOSIS — Z79899 Other long term (current) drug therapy: Secondary | ICD-10-CM | POA: Diagnosis not present

## 2020-08-09 DIAGNOSIS — I10 Essential (primary) hypertension: Secondary | ICD-10-CM | POA: Diagnosis not present

## 2020-08-09 DIAGNOSIS — W19XXXA Unspecified fall, initial encounter: Secondary | ICD-10-CM | POA: Diagnosis not present

## 2020-08-09 DIAGNOSIS — Z87891 Personal history of nicotine dependence: Secondary | ICD-10-CM | POA: Diagnosis not present

## 2020-08-09 DIAGNOSIS — R531 Weakness: Secondary | ICD-10-CM | POA: Insufficient documentation

## 2020-08-09 DIAGNOSIS — R296 Repeated falls: Secondary | ICD-10-CM | POA: Diagnosis not present

## 2020-08-09 DIAGNOSIS — Z7982 Long term (current) use of aspirin: Secondary | ICD-10-CM | POA: Diagnosis not present

## 2020-08-09 DIAGNOSIS — Y92002 Bathroom of unspecified non-institutional (private) residence single-family (private) house as the place of occurrence of the external cause: Secondary | ICD-10-CM | POA: Diagnosis not present

## 2020-08-09 DIAGNOSIS — R479 Unspecified speech disturbances: Secondary | ICD-10-CM

## 2020-08-09 DIAGNOSIS — R519 Headache, unspecified: Secondary | ICD-10-CM | POA: Insufficient documentation

## 2020-08-09 DIAGNOSIS — R4781 Slurred speech: Secondary | ICD-10-CM | POA: Insufficient documentation

## 2020-08-09 DIAGNOSIS — I639 Cerebral infarction, unspecified: Secondary | ICD-10-CM

## 2020-08-09 HISTORY — DX: Cerebral infarction, unspecified: I63.9

## 2020-08-09 LAB — URINALYSIS, ROUTINE W REFLEX MICROSCOPIC
Bilirubin Urine: NEGATIVE
Glucose, UA: NEGATIVE mg/dL
Hgb urine dipstick: NEGATIVE
Ketones, ur: NEGATIVE mg/dL
Leukocytes,Ua: NEGATIVE
Nitrite: NEGATIVE
Protein, ur: NEGATIVE mg/dL
Specific Gravity, Urine: 1.005 (ref 1.005–1.030)
pH: 6 (ref 5.0–8.0)

## 2020-08-09 LAB — BASIC METABOLIC PANEL
Anion gap: 10 (ref 5–15)
BUN: 5 mg/dL — ABNORMAL LOW (ref 6–20)
CO2: 26 mmol/L (ref 22–32)
Calcium: 8.8 mg/dL — ABNORMAL LOW (ref 8.9–10.3)
Chloride: 102 mmol/L (ref 98–111)
Creatinine, Ser: 0.71 mg/dL (ref 0.44–1.00)
GFR, Estimated: >60 mL/min (ref >60)
Glucose, Bld: 101 mg/dL — ABNORMAL HIGH (ref 70–99)
Potassium: 3.4 mmol/L — ABNORMAL LOW (ref 3.5–5.1)
Sodium: 138 mmol/L (ref 135–145)

## 2020-08-09 LAB — CBC
HCT: 37.7 % (ref 36.0–46.0)
Hemoglobin: 12.4 g/dL (ref 12.0–15.0)
MCH: 28.6 pg (ref 26.0–34.0)
MCHC: 32.9 g/dL (ref 30.0–36.0)
MCV: 86.9 fL (ref 80.0–100.0)
Platelets: 198 10*3/uL (ref 150–400)
RBC: 4.34 MIL/uL (ref 3.87–5.11)
RDW: 13.1 % (ref 11.5–15.5)
WBC: 3.9 10*3/uL — ABNORMAL LOW (ref 4.0–10.5)
nRBC: 0 % (ref 0.0–0.2)

## 2020-08-09 NOTE — ED Triage Notes (Signed)
Pt presents POV with falling backwards today approx 11:00 am. Pt also reports she felt confused, nauseous, vision "off" headache, increase thirst and feeling weak. Pt also reports BP 180/113. Pt states when she talked to her sister on the phone at 11am she felt like her speech was slurred. Pt's PCP sent her here to rule out a possible resolved stroke. Pt reports her symptoms have since cleared.  Speech cleared in triage, no neuro deficits noted, pt unable to tightly squeeze both hands due to arthritis, but grips were equal with the amount of pressure she was able to apply

## 2020-08-10 ENCOUNTER — Emergency Department (HOSPITAL_COMMUNITY): Payer: BC Managed Care – PPO

## 2020-08-10 DIAGNOSIS — R296 Repeated falls: Secondary | ICD-10-CM | POA: Diagnosis not present

## 2020-08-10 MED ORDER — ASPIRIN EC 81 MG PO TBEC: 81.0000 mg | DELAYED_RELEASE_TABLET | Freq: Every day | ORAL | 0 refills | Status: AC

## 2020-08-10 MED ORDER — IOHEXOL 350 MG/ML SOLN
75.0000 mL | Freq: Once | INTRAVENOUS | Status: AC | PRN
  Administered 2020-08-10: 11:00:00 75 mL via INTRAVENOUS

## 2020-08-10 MED ORDER — LORAZEPAM 2 MG/ML IJ SOLN: 1.0000 mg | Freq: Once | INTRAMUSCULAR | Status: DC

## 2020-08-10 NOTE — ED Notes (Signed)
Pt refusing MRI scan, notified Criss Alvine MD

## 2020-08-10 NOTE — ED Notes (Signed)
Reviewed discharge instructions with patient. Follow-up care and medications reviewed. Patient  verbalized understanding. Patient A&Ox4, VSS, and ambulatory with steady gait upon discharge.  °

## 2020-08-10 NOTE — ED Notes (Signed)
MRI called to notify this RN that pt is claustrophobic and needs meds. Notified provider. Pt being sent back to ED room.

## 2020-08-10 NOTE — ED Notes (Signed)
Pt back from MRI and and very anxious about going back to MRI

## 2020-08-10 NOTE — ED Provider Notes (Signed)
[facility] EMERGENCY DEPARTMENT Provider Note   CSN: [id number] Arrival date & time: 08/09/20  1702     History Chief Complaint  Patient presents with  . Fall  . Weakness    [name] is a 59 y.o. female.  HPI 59 year old female presents with fall and transient slurred speech.  She states that when she woke up yesterday morning around 4 or 5 AM she felt "off".  She did not feel like it was vertigo but she could not walk straight and states that she had to hold onto the walls.  She was in the bathroom and think she was doing her hair and had a sudden fall.  She did not hit her head because she got caught on the shower curtain.  Had another sudden fall later in the morning.  When she called her sister her speech was abnormal.  It like her tongue was thick but also felt like she could not get words out.  She would start a word like "ex" for expensive but could not say the rest of the world on multiple attempts.  She also had a somewhat headache which she often gets with high blood pressure and checked her blood pressure is 180/113.  Symptoms went away by the time her doctor called back in the early afternoon.  However she was told to come to the ER.  States she feels slightly off but is much better right now.  She feels like her left side might have been weaker but she cannot tell if that was from arthritis or true weakness yesterday.  Past Medical History:  Diagnosis Date  . AAT (alpha-1-antitrypsin) deficiency (HCC)   . Depression   . Dyspnea on exertion   . Enlarged heart   . Hypertension   . Palpitations     Patient Active Problem List   Diagnosis Date Noted  . Essential hypertension 09/29/2015  . Dyspnea on exertion 09/29/2015  . Palpitations 09/29/2015    Past Surgical History:  Procedure Laterality Date  . APPENDECTOMY    . BREAST ENHANCEMENT SURGERY    . CESAREAN SECTION    . CHOLECYSTECTOMY    . COSMETIC SURGERY       OB History   No  obstetric history on file.     Family History  Problem Relation Age of Onset  . Heart disease Mother   . Stroke Mother   . Hypertension Mother   . COPD Mother        alpha 1 antitrypsin deficiency  . Alpha-1 antitrypsin deficiency Mother        MZ  . Heart disease Father   . Glaucoma Father   . COPD Sister   . COPD Brother   . Healthy Brother   . Healthy Sister   . Breast cancer Maternal Aunt   . Breast cancer Paternal Aunt   . Breast cancer Cousin   . Breast cancer Maternal Aunt   . Breast cancer Cousin     Social History   Tobacco Use  . Smoking status: Former Smoker    Packs/day: 0.25    Types: Cigarettes    Quit date: 11/19/2018    Years since quitting: 1.7  . Smokeless tobacco: Never Used  . Tobacco comment: social smoking  Vaping Use  . Vaping Use: Never used  Substance Use Topics  . Alcohol use: Yes    Comment: occasionally  . Drug use: No    Home Medications Prior to Admission medications  Medication Sig Start Date End Date Taking? Authorizing Provider  aspirin EC 81 MG tablet Take 1 tablet (81 mg total) by mouth daily. Swallow whole. 08/10/20  Yes Pricilla Loveless, MD  atenolol (TENORMIN) 50 MG tablet Take 50 mg by mouth daily.   Yes [provider]  diclofenac (VOLTAREN) 75 MG EC tablet Take 75 mg by mouth 2 (two) times daily.   Yes [provider]  folic acid (FOLVITE) 1 MG tablet Take 1 mg by mouth daily. 07/27/20  Yes [provider]  gabapentin (NEURONTIN) 300 MG capsule Take 300 mg by mouth daily. 07/21/20  Yes [provider]  hydroxychloroquine (PLAQUENIL) 200 MG tablet Take 200 mg by mouth daily. 07/27/20  Yes [provider]  leflunomide (ARAVA) 20 MG tablet Take 20 mg by mouth daily. 08/09/20  Yes [provider]  methocarbamol (ROBAXIN) 500 MG tablet Take 750 mg by mouth at bedtime as needed for muscle spasms.   Yes [provider]  metoprolol tartrate (LOPRESSOR) 100 MG tablet Take  2 hours prior to CTA Patient not taking: Reported on 08/10/2020 10/06/19   Runell Gess, MD    Allergies    Codeine, Penicillins, and Sulfa drugs cross reactors  Review of Systems   Review of Systems  Musculoskeletal: Positive for gait problem.  Neurological: Positive for speech difficulty, weakness and headaches.  All other systems reviewed and are negative.   Physical Exam Updated Vital Signs BP (!) 141/90 (BP Location: Right Arm)   Pulse 73   Temp 98.3 F (36.8 C) (Oral)   Resp 16   Ht 5\' 5"  (1.651 m)   Wt 67.1 kg   SpO2 98%   BMI 24.63 kg/m   Physical Exam Vitals and nursing note reviewed.  Constitutional:      General: She is not in acute distress.    Appearance: She is well-developed and well-nourished. She is not ill-appearing or diaphoretic.  HENT:     Head: Normocephalic and atraumatic.     Right Ear: External ear normal.     Left Ear: External ear normal.     Nose: Nose normal.  Eyes:     General:        Right eye: No discharge.        Left eye: No discharge.     Extraocular Movements: Extraocular movements intact.     Pupils: Pupils are equal, round, and reactive to light.  Cardiovascular:     Rate and Rhythm: Normal rate and regular rhythm.     Heart sounds: Normal heart sounds.  Pulmonary:     Effort: Pulmonary effort is normal.     Breath sounds: Normal breath sounds.  Abdominal:     Palpations: Abdomen is soft.     Tenderness: There is no abdominal tenderness.  Skin:    General: Skin is warm and dry.  Neurological:     Mental Status: She is alert.     Comments: CN 3-12 grossly intact. 5/5 strength in all 4 extremities. Grossly normal sensation. Normal finger to nose. Normal gait  Psychiatric:        Mood and Affect: Mood is not anxious.     ED Results / Procedures / Treatments   Labs (all labs ordered are listed, but only abnormal results are displayed) Labs Reviewed  BASIC METABOLIC PANEL - Abnormal; Notable for the following  components:      Result Value   Potassium 3.4 (*)    Glucose, Bld 101 (*)  BUN 5 (*)    Calcium 8.8 (*)    All other components within normal limits  CBC - Abnormal; Notable for the following components:   WBC 3.9 (*)    All other components within normal limits  URINALYSIS, ROUTINE W REFLEX MICROSCOPIC - Abnormal; Notable for the following components:   Color, Urine STRAW (*)    All other components within normal limits  CBG MONITORING, ED    EKG EKG Interpretation  Date/Time:  Tuesday August 09 2020 17:07:38 EST Ventricular Rate:  83 PR Interval:  146 QRS Duration: 90 QT Interval:  374 QTC Calculation: 439 R Axis:   77 Text Interpretation: Normal sinus rhythm Normal ECG When compared with ECG of 09/16/2015, Rightward axis is no longer present Confirmed by Dione Booze ([id number]) on 08/09/2020 11:59:21 PM   Radiology CT Angio Head W or Wo Contrast  Result Date: 08/10/2020 CLINICAL DATA:  Multiple falls EXAM: CT ANGIOGRAPHY HEAD AND NECK TECHNIQUE: Multidetector CT imaging of the head and neck was performed using the standard protocol during bolus administration of intravenous contrast. Multiplanar CT image reconstructions and MIPs were obtained to evaluate the vascular anatomy. Carotid stenosis measurements (when applicable) are obtained utilizing NASCET criteria, using the distal internal carotid diameter as the denominator. CONTRAST:  19mL OMNIPAQUE IOHEXOL 350 MG/ML SOLN COMPARISON:  CT head 08/09/2020 FINDINGS: CT HEAD Brain: There is no acute intracranial hemorrhage, mass effect, or edema. Gray-white differentiation is preserved. There is no extra-axial fluid collection. Ventricles and sulci are within normal limits in size and configuration. Vascular: No hyperdense vessel or unexpected calcification. Skull: Calvarium is unremarkable. Sinuses/Orbits: No acute finding. Other: None. Review of the MIP images confirms the above findings CTA NECK Aortic arch: Great vessel origins are  patent. Right carotid system: Patent.  No measurable stenosis. Left carotid system: Patent.  No measurable stenosis. Vertebral arteries: Patent and codominant. No measurable stenosis or evidence of dissection. Skeleton: Degenerative changes of the cervical spine. Other neck: No mass or adenopathy. Upper chest: No apical lung mass. Review of the MIP images confirms the above findings CTA HEAD Anterior circulation: Intracranial internal carotid arteries are patent. Anterior and middle cerebral arteries are patent. Posterior circulation: Intracranial vertebral arteries, basilar artery, and posterior cerebral arteries are patent. A right posterior communicating artery is identified. Venous sinuses: Patent as allowed by contrast bolus timing. Review of the MIP images confirms the above findings IMPRESSION: No acute intracranial abnormality. No large vessel occlusion or hemodynamically significant stenosis. No evidence of vertebral artery dissection. Electronically Signed   By: Guadlupe Spanish M.D.   On: 08/10/2020 11:38   CT HEAD WO CONTRAST  Result Date: 08/09/2020 CLINICAL DATA:  Nausea, confusion and elevated blood pressures EXAM: CT HEAD WITHOUT CONTRAST TECHNIQUE: Contiguous axial images were obtained from the base of the skull through the vertex without intravenous contrast. COMPARISON:  September 16, 2015 FINDINGS: Brain: No evidence of acute infarction, hemorrhage, hydrocephalus, extra-axial collection or mass lesion/mass effect. Mild generalized cerebral atrophy. Vascular: No hyperdense vessel or unexpected calcification. Skull: Normal. Negative for fracture or focal lesion. Sinuses/Orbits: Visualized paranasal sinuses and orbits are unremarkable. Other: None. IMPRESSION: No acute intracranial abnormality. Mild atrophy. Electronically Signed   By: Donzetta Kohut M.D.   On: 08/09/2020 18:37   CT Angio Neck W and/or Wo Contrast  Result Date: 08/10/2020 CLINICAL DATA:  Multiple falls EXAM: CT ANGIOGRAPHY  HEAD AND NECK TECHNIQUE: Multidetector CT imaging of the head and neck was performed using the standard protocol during bolus administration  of intravenous contrast. Multiplanar CT image reconstructions and MIPs were obtained to evaluate the vascular anatomy. Carotid stenosis measurements (when applicable) are obtained utilizing NASCET criteria, using the distal internal carotid diameter as the denominator. CONTRAST:  61mL OMNIPAQUE IOHEXOL 350 MG/ML SOLN COMPARISON:  CT head 08/09/2020 FINDINGS: CT HEAD Brain: There is no acute intracranial hemorrhage, mass effect, or edema. Gray-white differentiation is preserved. There is no extra-axial fluid collection. Ventricles and sulci are within normal limits in size and configuration. Vascular: No hyperdense vessel or unexpected calcification. Skull: Calvarium is unremarkable. Sinuses/Orbits: No acute finding. Other: None. Review of the MIP images confirms the above findings CTA NECK Aortic arch: Great vessel origins are patent. Right carotid system: Patent.  No measurable stenosis. Left carotid system: Patent.  No measurable stenosis. Vertebral arteries: Patent and codominant. No measurable stenosis or evidence of dissection. Skeleton: Degenerative changes of the cervical spine. Other neck: No mass or adenopathy. Upper chest: No apical lung mass. Review of the MIP images confirms the above findings CTA HEAD Anterior circulation: Intracranial internal carotid arteries are patent. Anterior and middle cerebral arteries are patent. Posterior circulation: Intracranial vertebral arteries, basilar artery, and posterior cerebral arteries are patent. A right posterior communicating artery is identified. Venous sinuses: Patent as allowed by contrast bolus timing. Review of the MIP images confirms the above findings IMPRESSION: No acute intracranial abnormality. No large vessel occlusion or hemodynamically significant stenosis. No evidence of vertebral artery dissection.  Electronically Signed   By: Guadlupe Spanish M.D.   On: 08/10/2020 11:38    Procedures Procedures (including critical care time)  Medications Ordered in ED Medications  iohexol (OMNIPAQUE) 350 MG/ML injection 75 mL (75 mLs Intravenous Contrast Given 08/10/20 1111)    ED Course  I have reviewed the triage vital signs and the nursing notes.  Pertinent labs & imaging results that were available during my care of the patient were reviewed by me and considered in my medical decision making (see chart for details).  Clinical Course as of 08/10/20 1602  Wed Aug 10, 2020  0950 Dr Otelia Limes recommends MRI, CTA head/neck, and baby ASA at home if all negative, outpatient neuro follow up. [SG]    Clinical Course User Index [SG] Pricilla Loveless, MD   MDM Rules/Calculators/A&P                          Patient CTA is unremarkable.  We tried to do an MRI but she became very anxious.  Offered to give her treatment for anxiety for MRI but she declines.  Given this was questionable whether this is truly neurologic versus functional per neurology, I discussed that we do not have to do MRI as she is repeatedly declining MRI.  Discussed return precautions and need to start baby aspirin.  She is asymptomatic at this time.  Follow-up with neurology Final Clinical Impression(s) / ED Diagnoses Final diagnoses:  Difficulty with speech    Rx / DC Orders ED Discharge Orders         Ordered    Ambulatory referral to Neurology       Comments: An appointment is requested in approximately: 2 weeks   08/10/20 1201    aspirin EC 81 MG tablet  Daily        08/10/20 1202           Pricilla Loveless, MD 08/10/20 1603

## 2020-08-10 NOTE — ED Notes (Signed)
Pt discharged at this time.

## 2020-08-10 NOTE — ED Notes (Signed)
Patient transported to CT then MRI 

## 2020-08-10 NOTE — Progress Notes (Signed)
MRI was attempted. Once patient was in the scanner she got anxious and wanted to come out. She stated she would not be able to do it. I offered to call to see if they would order anti-anxiety medication. She declined and stated she would have to be "knocked out" in order to do the MRI. RN informed and patient sent back to ED.

## 2020-08-18 DIAGNOSIS — M069 Rheumatoid arthritis, unspecified: Secondary | ICD-10-CM | POA: Diagnosis not present

## 2020-08-18 DIAGNOSIS — I10 Essential (primary) hypertension: Secondary | ICD-10-CM | POA: Diagnosis not present

## 2020-08-22 DIAGNOSIS — M797 Fibromyalgia: Secondary | ICD-10-CM | POA: Diagnosis not present

## 2020-08-22 DIAGNOSIS — Z79899 Other long term (current) drug therapy: Secondary | ICD-10-CM | POA: Diagnosis not present

## 2020-08-22 DIAGNOSIS — M3500 Sicca syndrome, unspecified: Secondary | ICD-10-CM | POA: Diagnosis not present

## 2020-08-22 DIAGNOSIS — R52 Pain, unspecified: Secondary | ICD-10-CM | POA: Diagnosis not present

## 2020-08-22 DIAGNOSIS — M0609 Rheumatoid arthritis without rheumatoid factor, multiple sites: Secondary | ICD-10-CM | POA: Diagnosis not present

## 2020-08-26 DIAGNOSIS — I10 Essential (primary) hypertension: Secondary | ICD-10-CM | POA: Diagnosis not present

## 2020-08-26 DIAGNOSIS — M2550 Pain in unspecified joint: Secondary | ICD-10-CM | POA: Diagnosis not present

## 2020-08-26 DIAGNOSIS — M069 Rheumatoid arthritis, unspecified: Secondary | ICD-10-CM | POA: Diagnosis not present

## 2020-08-26 DIAGNOSIS — M3500 Sicca syndrome, unspecified: Secondary | ICD-10-CM | POA: Diagnosis not present

## 2020-08-31 DIAGNOSIS — H16143 Punctate keratitis, bilateral: Secondary | ICD-10-CM | POA: Diagnosis not present

## 2020-08-31 DIAGNOSIS — M3500 Sicca syndrome, unspecified: Secondary | ICD-10-CM | POA: Diagnosis not present

## 2020-09-05 DIAGNOSIS — I10 Essential (primary) hypertension: Secondary | ICD-10-CM | POA: Diagnosis not present

## 2020-09-28 ENCOUNTER — Encounter: Payer: Self-pay | Admitting: Primary Care

## 2020-09-28 ENCOUNTER — Other Ambulatory Visit: Payer: Self-pay

## 2020-09-28 ENCOUNTER — Ambulatory Visit (INDEPENDENT_AMBULATORY_CARE_PROVIDER_SITE_OTHER): Payer: BC Managed Care – PPO | Admitting: Primary Care

## 2020-09-28 ENCOUNTER — Ambulatory Visit: Payer: BC Managed Care – PPO | Admitting: Pulmonary Disease

## 2020-09-28 VITALS — BP 118/78 | HR 77 | Temp 98.1°F | Ht 65.0 in | Wt 148.0 lb

## 2020-09-28 DIAGNOSIS — R0600 Dyspnea, unspecified: Secondary | ICD-10-CM | POA: Diagnosis not present

## 2020-09-28 DIAGNOSIS — Z148 Genetic carrier of other disease: Secondary | ICD-10-CM

## 2020-09-28 DIAGNOSIS — M069 Rheumatoid arthritis, unspecified: Secondary | ICD-10-CM | POA: Insufficient documentation

## 2020-09-28 DIAGNOSIS — M059 Rheumatoid arthritis with rheumatoid factor, unspecified: Secondary | ICD-10-CM

## 2020-09-28 DIAGNOSIS — R0609 Other forms of dyspnea: Secondary | ICD-10-CM

## 2020-09-28 LAB — HEPATIC FUNCTION PANEL
ALT: 51 U/L — ABNORMAL HIGH (ref 0–35)
AST: 31 U/L (ref 0–37)
Albumin: 4.2 g/dL (ref 3.5–5.2)
Alkaline Phosphatase: 71 U/L (ref 39–117)
Bilirubin, Direct: 0.1 mg/dL (ref 0.0–0.3)
Total Bilirubin: 0.5 mg/dL (ref 0.2–1.2)
Total Protein: 6.7 g/dL (ref 6.0–8.3)

## 2020-09-28 NOTE — Patient Instructions (Addendum)
Nice meeting you today [name]  Orders: Pulmonary function testing in 3-6 months Liver function test today    Alpha 1 deficiency (per [facility] alpha 1 clinic):  Recommendation for the following to be conducted locally: 1. Liver FibroScan to evaluate for evidence of early fibrosis conducted now and every 2 years 2. Annual breathing test to monitor lung function 3. Annual liver function testing through primary care provider 4. Agree with heart evaluation as possible cause for shortness of breath. If this work-up is unremarkable, would consider trying a daily inhaler and getting a CAT scan of your chest  6. We can see you back on an as-needed basis as you have established lung and primary care doctor locally  *You may need CT chest at some point to monitor connective tissue disease/rheumatooig   Follow-up Needs to establish with either Dr. Judeth Horn or Francine Graven after PFTs (former [facility] patient)

## 2020-09-28 NOTE — Assessment & Plan Note (Addendum)
-   Following with Dr. Aryal/ rheumatology, maintained on Plaquenil

## 2020-09-28 NOTE — Assessment & Plan Note (Addendum)
-   Dyspnea x 4 years. More winded with shorter distances and talking. Needs repeat PFTs. CXR in October 2020 showed clear lungs. Would recommend CT chest if symptoms or diffusion capacity worsen d/t hx RA. Patient would like to hold off at this time d/t high deductible.

## 2020-09-28 NOTE — Progress Notes (Signed)
@Patient  ID: [name], female    DOB: 09-02-1960, 60 y.o.   MRN: [id number]  Chief Complaint  Patient presents with  . Follow-up    SOB with exertion.     Referring provider: London Pepper, MD  HPI: 60 year old female, former light smoker quit in April 2020.  Past medical history significant for hypertension, alpha 1 antitrypsin deficiency, dyspnea on exertion, sjogren's syndrome, raynauds syndrome, rheumatoid.  Former patient of Dr. Carlis Abbott, last seen in office on 09/16/2019.  PFTs not diagnostic of COPD or suggestive of asthma.  Carrier of alpha 1 antitrypsin mutation- PI*MZ.  High risk for progression to COPD given smoking history and low A1 AT level.  Maintenance inhalers not indicated at this time.  Following with [facility] alpha-1 antitrypsin clinic.  PFTs in 1 year given risk of progression.  Evaluation recommended for dyspnea.  09/28/2020 Patient presents today for 1 year follow-up alpha-1 antitrypsin deficiency.  She is a former smoker.  She has been experiencing dyspnea on exertion for the last 4 years. She does not report a significant change in her breathing. She does get out of breath easier with shorter distances and talking.CXR in October 2020 showed clear lungs. PFTs in February 2021 was not suggestive of COPD or asthma.  She met with [facility] alpha 1 clinic in February 2021. They recommended annual breathing tests as well, annual liver function testing and liver fibroscan every 2 years. She does not need replacement. She has been on Plaquinel since June 2021 for rheumatoid arthritis. She follows with Dr. Kathlene November with rheumatology. She is currently on short term disability d/t arthritis symptoms and mild stroke in December 2021 and she has. She has a high deductive and would like to hold off on any testing that is not necessarily right now.   Testing 10/20/19 Echocardiogram-EF 60 to 65%. Mild left ventricular hypertrophy.  Normal left ventricular diastolic parameters. Essentially normal study.    09/16/19 PFTs - FVC 3.71 (102%), FEV1 3.01 (106%), ratio 81, TLC 99%) DLCOunc 19.45 (88%), no bronchodilator response. Normal pulmonary function   Allergies  Allergen Reactions  . Codeine Nausea And Vomiting  . Penicillins Rash  . Sulfa Drugs Cross Reactors Rash    Immunization History  Administered Date(s) Administered  . Influenza,inj,Quad PF,6+ Mos 07/20/2019  . Influenza-Unspecified 05/24/2020  . Pneumococcal Conjugate-13 07/20/2019    Past Medical History:  Diagnosis Date  . AAT (alpha-1-antitrypsin) deficiency ([place])   . Depression   . Dyspnea on exertion   . Enlarged heart   . Hypertension   . Palpitations     Tobacco History: Social History   Tobacco Use  Smoking Status Former Smoker  . Packs/day: 0.25  . Types: Cigarettes  . Quit date: 11/19/2018  . Years since quitting: 1.8  Smokeless Tobacco Never Used  Tobacco Comment   social smoking   Counseling given: Not Answered Comment: social smoking   Outpatient Medications Prior to Visit  Medication Sig Dispense Refill  . aspirin EC 81 MG tablet Take 1 tablet (81 mg total) by mouth daily. Swallow whole. 30 tablet 0  . atenolol (TENORMIN) 50 MG tablet Take 50 mg by mouth daily.    . diclofenac (VOLTAREN) 75 MG EC tablet Take 75 mg by mouth 2 (two) times daily.    . DULoxetine (CYMBALTA) 20 MG capsule Take 20 mg by mouth daily.    . folic acid (FOLVITE) 1 MG tablet Take 1 mg by mouth daily.    Marland Kitchen gabapentin (NEURONTIN) 300 MG capsule Take  300 mg by mouth daily.    . hydroxychloroquine (PLAQUENIL) 200 MG tablet Take 400 mg by mouth 2 (two) times daily.    Marland Kitchen leflunomide (ARAVA) 20 MG tablet Take 20 mg by mouth daily.    Marland Kitchen lisinopril (ZESTRIL) 5 MG tablet Take 5 mg by mouth daily.    . methocarbamol (ROBAXIN) 500 MG tablet Take 750 mg by mouth in the morning and at bedtime.    . metoprolol tartrate (LOPRESSOR) 100 MG tablet Take 2 hours prior to CTA (Patient not taking: No sig reported) 1 tablet 0   No  facility-administered medications prior to visit.   Review of Systems  Review of Systems  Constitutional: Negative.   Respiratory: Negative for cough, shortness of breath and wheezing.        DOE  Cardiovascular: Negative.   Musculoskeletal: Positive for arthralgias.   Physical Exam  BP 118/78 (BP Location: Left Arm, Cuff Size: Normal)   Pulse 77   Temp 98.1 F (36.7 C)   Ht 5' 5"  (1.651 m)   Wt 148 lb (67.1 kg)   SpO2 98%   BMI 24.63 kg/m  Physical Exam Constitutional:      Appearance: Normal appearance.  HENT:     Head: Normocephalic and atraumatic.     Mouth/Throat:     Comments: Deferred d/t masking Cardiovascular:     Rate and Rhythm: Normal rate and regular rhythm.  Pulmonary:     Effort: Pulmonary effort is normal.     Breath sounds: Normal breath sounds.  Musculoskeletal:        General: Normal range of motion.  Skin:    General: Skin is warm and dry.  Neurological:     General: No focal deficit present.     Mental Status: She is alert and oriented to person, place, and time. Mental status is at baseline.  Psychiatric:        Mood and Affect: Mood normal.        Behavior: Behavior normal.        Thought Content: Thought content normal.        Judgment: Judgment normal.      Lab Results:  CBC    Component Value Date/Time   WBC 3.9 (L) 08/09/2020 1723   RBC 4.34 08/09/2020 1723   HGB 12.4 08/09/2020 1723   HCT 37.7 08/09/2020 1723   PLT 198 08/09/2020 1723   MCV 86.9 08/09/2020 1723   MCV 89.6 05/20/2012 1144   MCH 28.6 08/09/2020 1723   MCHC 32.9 08/09/2020 1723   RDW 13.1 08/09/2020 1723    BMET    Component Value Date/Time   NA 138 08/09/2020 1723   NA 138 10/06/2019 1229   K 3.4 (L) 08/09/2020 1723   CL 102 08/09/2020 1723   CO2 26 08/09/2020 1723   GLUCOSE 101 (H) 08/09/2020 1723   BUN 5 (L) 08/09/2020 1723   BUN 15 10/06/2019 1229   CREATININE 0.71 08/09/2020 1723   CALCIUM 8.8 (L) 08/09/2020 1723   GFRNONAA >60 08/09/2020  1723   GFRAA 96 10/06/2019 1229    BNP No results found for: BNP  ProBNP No results found for: PROBNP  Imaging: No results found.   Assessment & Plan:   Alpha-1-antitrypsin deficiency carrier - Evaluated by [facility] alpha-1 clinic in February 2021. She is a carrier of alpha 1 antitrypsin mutation with MZ genotype. Augmentation therapy is not recommended. PFTs in February 2021 were not suggestive of COPD or asthma. Recommend annual breathing  tests, annual liver function testing and liver fibroscan every 2 years. She will get hepatic panel drawn today, she wants to wait 3-6 months for PFTs d/t cost.   Dyspnea on exertion - Dyspnea x 4 years. More winded with shorter distances and talking. Needs repeat PFTs. CXR in October 2020 showed clear lungs. Would recommend CT chest if symptoms or diffusion capacity worsen d/t hx RA. Patient would like to hold off at this time d/t high deductible.   Rheumatoid arthritis [facility]) - Following with Dr. Aryal/ rheumatology, maintained on [place], NP 09/28/2020

## 2020-09-28 NOTE — Assessment & Plan Note (Addendum)
-   Evaluated by [facility] alpha-1 clinic in February 2021. She is a carrier of alpha 1 antitrypsin mutation with MZ genotype. Augmentation therapy is not recommended. PFTs in February 2021 were not suggestive of COPD or asthma. Recommend annual breathing tests, annual liver function testing and liver fibroscan every 2 years. She will get hepatic panel drawn today, she wants to wait 3-6 months for PFTs d/t cost.

## 2020-10-06 DIAGNOSIS — Z01411 Encounter for gynecological examination (general) (routine) with abnormal findings: Secondary | ICD-10-CM | POA: Diagnosis not present

## 2020-10-06 DIAGNOSIS — Z6824 Body mass index (BMI) 24.0-24.9, adult: Secondary | ICD-10-CM | POA: Diagnosis not present

## 2020-10-06 DIAGNOSIS — Z124 Encounter for screening for malignant neoplasm of cervix: Secondary | ICD-10-CM | POA: Diagnosis not present

## 2020-10-06 DIAGNOSIS — Z113 Encounter for screening for infections with a predominantly sexual mode of transmission: Secondary | ICD-10-CM | POA: Diagnosis not present

## 2020-10-06 DIAGNOSIS — Z01419 Encounter for gynecological examination (general) (routine) without abnormal findings: Secondary | ICD-10-CM | POA: Diagnosis not present

## 2020-10-07 DIAGNOSIS — M549 Dorsalgia, unspecified: Secondary | ICD-10-CM | POA: Diagnosis not present

## 2020-10-07 DIAGNOSIS — I10 Essential (primary) hypertension: Secondary | ICD-10-CM | POA: Diagnosis not present

## 2020-10-07 DIAGNOSIS — M3500 Sicca syndrome, unspecified: Secondary | ICD-10-CM | POA: Diagnosis not present

## 2020-10-07 DIAGNOSIS — M069 Rheumatoid arthritis, unspecified: Secondary | ICD-10-CM | POA: Diagnosis not present

## 2020-10-07 DIAGNOSIS — M2550 Pain in unspecified joint: Secondary | ICD-10-CM | POA: Diagnosis not present

## 2020-10-12 ENCOUNTER — Telehealth (INDEPENDENT_AMBULATORY_CARE_PROVIDER_SITE_OTHER): Payer: BC Managed Care – PPO | Admitting: Cardiology

## 2020-10-12 VITALS — Ht 65.0 in | Wt 147.0 lb

## 2020-10-12 DIAGNOSIS — R0609 Other forms of dyspnea: Secondary | ICD-10-CM

## 2020-10-12 DIAGNOSIS — I10 Essential (primary) hypertension: Secondary | ICD-10-CM

## 2020-10-12 DIAGNOSIS — Z148 Genetic carrier of other disease: Secondary | ICD-10-CM | POA: Diagnosis not present

## 2020-10-12 DIAGNOSIS — R0600 Dyspnea, unspecified: Secondary | ICD-10-CM | POA: Diagnosis not present

## 2020-10-12 NOTE — Assessment & Plan Note (Signed)
Chronic, non cardiac 

## 2020-10-12 NOTE — Assessment & Plan Note (Signed)
Per [facility] she does not need augmentation therapy  Needs annual PFTs, liver function testing and liver fibroscan every 2 years-[facility] follows

## 2020-10-12 NOTE — Progress Notes (Signed)
Virtual Visit via Telephone Note   This visit type was conducted due to national recommendations for restrictions regarding the COVID-19 Pandemic (e.g. social distancing) in an effort to limit this patient's exposure and mitigate transmission in our community.  Due to her co-morbid illnesses, this patient is at least at moderate risk for complications without adequate follow up.  This format is felt to be most appropriate for this patient at this time.  The patient did not have access to video technology/had technical difficulties with video requiring transitioning to audio format only (telephone).  All issues noted in this document were discussed and addressed.  No physical exam could be performed with this format.  Please refer to the patient's chart for her  consent to telehealth for [facility].    Date:  10/12/2020   ID:  [name], DOB 08/06/1961, MRN [id number] The patient was identified using 2 identifiers.  Patient Location: Home Provider Location: Office/Clinic   PCP:  Farris Has, MD   [facility]  Cardiologist:  Dr Allyson Sabal Advanced Practice Provider:  No care team member to display Electrophysiologist: [id number]   Evaluation Performed:  Follow-Up Visit  Chief Complaint:  Dry cough  History of Present Illness:    [name] is a 60 y.o. female with a history of chronic dyspnea on exertion.  Cardiac work-up includes an echocardiogram in March 2021 that was essentially normal.  She also had a coronary CTA in April 2021 that showed no coronary disease and a calcium score of 0.  We do not feel her dyspnea is secondary to a cardiac issue.  She does see a pulmonologist, Dr. Chestine Spore and was evaluated at [facility] by Dr Okey Regal. .  She has alpha -1 antitrypsin deficiency, MZ genotype. The MZ genotype is not associated with an increased risk for lung disease in nonsmokers. Higher risk is seen in MZ individuals who smoke, the patient is a  former smoker.  She was contacted today for routine follow-up.  She continues to have dyspnea on exertion.  She was seen in the emergency room in December 2021 with transient slurred speech.  Head CT was unremarkable.  MRI was ordered but the patient could not tolerate that.  She does have a follow-up with Dr. Pearlean Brownie scheduled for 10/17/2020.  She was placed on an aspirin 81 mg. She denies any history of palpitations or tachycardia.  From a cardiac standpoint she has had some hypertension.  Her primary care provider added lisinopril.  The patient tells me she has a persistent dry cough and hoarseness for the past 2 months.     The patient does not have symptoms concerning for COVID-19 infection (fever, chills, cough, or new shortness of breath).    Past Medical History:  Diagnosis Date  . AAT (alpha-1-antitrypsin) deficiency (HCC)   . Depression   . Dyspnea on exertion   . Enlarged heart   . Hypertension   . Palpitations    Past Surgical History:  Procedure Laterality Date  . APPENDECTOMY    . BREAST ENHANCEMENT SURGERY    . CESAREAN SECTION    . CHOLECYSTECTOMY    . COSMETIC SURGERY       Current Meds  Medication Sig  . Artificial Saliva (BIOTENE ORALBALANCE DRY MOUTH MT) Use as directed in the mouth or throat 4 (four) times daily.  Marland Kitchen aspirin EC 81 MG tablet Take 1 tablet (81 mg total) by mouth daily. Swallow whole.  Marland Kitchen atenolol (TENORMIN) 50 MG tablet  Take 50 mg by mouth daily.  . diclofenac (VOLTAREN) 75 MG EC tablet Take 75 mg by mouth 2 (two) times daily.  . folic acid (FOLVITE) 1 MG tablet Take 1 mg by mouth daily.  Marland Kitchen gabapentin (NEURONTIN) 300 MG capsule Take 300 mg by mouth daily.  . hydroxychloroquine (PLAQUENIL) 200 MG tablet Take 400 mg by mouth daily. Pt states 400 mg once daily  . leflunomide (ARAVA) 20 MG tablet Take 20 mg by mouth daily.  Marland Kitchen lisinopril (ZESTRIL) 10 MG tablet Take 10 mg by mouth daily.  . methocarbamol (ROBAXIN) 500 MG tablet Take 750 mg by mouth in  the morning and at bedtime.     Allergies:   Codeine, Penicillins, and Sulfa drugs cross reactors   Social History   Tobacco Use  . Smoking status: Former Smoker    Packs/day: 0.25    Types: Cigarettes    Quit date: 11/19/2018    Years since quitting: 1.8  . Smokeless tobacco: Never Used  . Tobacco comment: social smoking  Vaping Use  . Vaping Use: Never used  Substance Use Topics  . Alcohol use: Yes    Comment: occasionally  . Drug use: No     Family Hx: The patient's family history includes Alpha-1 antitrypsin deficiency in her mother; Breast cancer in her cousin, cousin, maternal aunt, maternal aunt, and paternal aunt; COPD in her brother, mother, and sister; Glaucoma in her father; Healthy in her brother and sister; Heart disease in her father and mother; Hypertension in her mother; Stroke in her mother.  ROS:   Please see the history of present illness.    All other systems reviewed and are negative.   Prior CV studies:   The following studies were reviewed today: Coronary CTA  11/17/2019-  IMPRESSION: 1. Coronary calcium score of 0. This was 0 percentile for age and sex matched control.  2. Normal coronary origin with right dominance.  3. No evidence of CAD.    Labs/Other Tests and Data Reviewed:    EKG:  An ECG dated 08/09/2020 was personally reviewed today and demonstrated:  NSR, HR 83  Recent Labs: 08/09/2020: BUN 5; Creatinine, Ser 0.71; Hemoglobin 12.4; Platelets 198; Potassium 3.4; Sodium 138 09/28/2020: ALT 51   Recent Lipid Panel No results found for: CHOL, TRIG, HDL, CHOLHDL, LDLCALC, LDLDIRECT  Wt Readings from Last 3 Encounters:  10/12/20 147 lb (66.7 kg)  09/28/20 148 lb (67.1 kg)  08/09/20 148 lb (67.1 kg)     Risk Assessment/Calculations:      Objective:    Vital Signs:  Ht 5\' 5"  (1.651 m)   Wt 147 lb (66.7 kg)   BMI 24.46 kg/m    VITAL SIGNS:  reviewed  ASSESSMENT & PLAN:    DOE- Secondary to pulmonary  disease  HTN- Uncontrolled- dry cough-? Secondary to ACE.  F/U with PCP- consider changing her to an ARB  Alpha-1-antitrypsin deficiency carrier Per [facility] she does not need augmentation therapy  Needs annual PFTs, liver function testing and liver fibroscan every 2 years-[facility] follows  Possible TIA- See ED visit Dec 2021.  Neuro f/u 10/17/2020.  She is on ASA 81 mg.      COVID-19 Education: The signs and symptoms of COVID-19 were discussed with the patient and how to seek care for testing (follow up with PCP or arrange E-visit).  The importance of social distancing was discussed today.  Time:   Today, I have spent 25 minutes with the patient with telehealth technology  discussing the above problems.     Medication Adjustments/Labs and Tests Ordered: Current medicines are reviewed at length with the patient today.  Concerns regarding medicines are outlined above.   Tests Ordered: No orders of the defined types were placed in this encounter.   Medication Changes: No orders of the defined types were placed in this encounter.   Follow Up:  Virtual Visit  12 months  Signed, Corine Shelter, [place]  10/12/2020 10:43 AM    [facility]

## 2020-10-12 NOTE — Assessment & Plan Note (Signed)
Sub optimal control.  Dry cough- ? Secondary to ACE. I have asked her to contact her PCP-? Consider change to ARB

## 2020-10-12 NOTE — Patient Instructions (Signed)
Medication Instructions:  No changes   If you need a refill on your cardiac medications before your next appointment, please call your pharmacy   Lab Work: No labs ordered  If you have labs (blood work) drawn today and your tests are completely normal, you will receive your results only by: Marland Kitchen MyChart Message (if you have MyChart) OR . A paper copy in the mail If you have any lab test that is abnormal or we need to change your treatment, we will call you to review the results.   Testing/Procedures: No testing ordered   Follow-Up: At [facility], you and your health needs are our priority.  As part of our continuing mission to provide you with exceptional heart care, we have created designated Provider Care Teams.  These Care Teams include your primary Cardiologist (physician) and Advanced Practice Providers (APPs -  Physician Assistants and Nurse Practitioners) who all work together to provide you with the care you need, when you need it.  We recommend signing up for the patient portal called "MyChart".  Sign up information is provided on this After Visit Summary.  MyChart is used to connect with patients for Virtual Visits (Telemedicine).  Patients are able to view lab/test results, encounter notes, upcoming appointments, etc.  Non-urgent messages can be sent to your provider as well.   To learn more about what you can do with MyChart, go to [email or web address].    Your next appointment:   1 year(s)  The format for your next appointment:   Virtual Visit   Provider:   You may see No primary care provider on file.  DR. Allyson Sabal or one of the following Advanced Practice Providers on your designated Care Team:    [place], PA-C  Edd Fabian, FNP    Other Instructions

## 2020-10-17 ENCOUNTER — Ambulatory Visit (INDEPENDENT_AMBULATORY_CARE_PROVIDER_SITE_OTHER): Payer: BC Managed Care – PPO | Admitting: Neurology

## 2020-10-17 ENCOUNTER — Encounter: Payer: Self-pay | Admitting: Neurology

## 2020-10-17 ENCOUNTER — Other Ambulatory Visit: Payer: Self-pay

## 2020-10-17 VITALS — BP 133/87 | HR 69 | Ht 65.0 in | Wt 146.0 lb

## 2020-10-17 DIAGNOSIS — R296 Repeated falls: Secondary | ICD-10-CM | POA: Diagnosis not present

## 2020-10-17 DIAGNOSIS — G459 Transient cerebral ischemic attack, unspecified: Secondary | ICD-10-CM

## 2020-10-17 MED ORDER — ALPRAZOLAM 0.25 MG PO TABS: 0.2500 mg | ORAL_TABLET | Freq: Once | ORAL | 0 refills | Status: AC

## 2020-10-17 NOTE — Progress Notes (Signed)
[facility] [address] [place]. [place] [address] [phone]       OFFICE CONSULT NOTE  [name] Date of Birth:  [birth year] Medical Record Number:  [id number]   Referring MD: Pricilla Loveless  Reason for Referral: TIA  HPI: [name] is a pleasant 60 year old Caucasian lady with past medical history of syndrome, rheumatoid arthritis, hypertension, chronic pain who is seen today for initial office consultation visit for TIA and frequent falls..  History is obtained from the patient, review of electronic medical records and I personally reviewed available imaging films in PACS.  Patient's states that on 08/09/2020 she felt dizzy off balance and was leaning to one side.  She denied true vertigo but could not walk straight and had to hold onto the walls.  She went to the bathroom and she fell backwards.  She states the tub broke her fall and she hit her neck and shoulders.  She did not hit her head.  She did not lose consciousness.  She had another fall the next day morning and hence called her sister and sister noted that her speech was not clear.  Patient felt that her tongue was thick and she had some trouble getting her words out.  She had a mild headache but it was not bothersome.  She was seen in the ER blood pressure slightly elevated 180/113.  Symptoms had resolved by that time.  CT scan of the head was obtained which I personally reviewed showed no acute abnormality and CT angiogram of the brain and neck also did not reveal significant large vessel extracranial intracranial stenosis.  CBC was unremarkable and basic metabolic panel level significant only for a slightly low potassium of 3.4.  EKG was unremarkable.  Patient was started on aspirin.  MRI was discussed with patient vehemently refused MRI due to claustrophobia.  Patient states she has had no prior history of strokes or TIAs.  She does have history however of frequent falls.  She describes these as  happening without warning.  Mostly she falls backwards.  She is never hit her head or lost consciousness.  She denies any vertigo, headache, palpitations, chest tightness or sweating prior to these falls.  She states she has lots of back pain and body aches due to her Sjogren's syndrome and rheumatoid arthritis.  She does see a rheumatologist Dr. Deanne Coffer for the same.  She also complains of tingling and numbness in the hands and feet which is longstanding but this is not getting worse and is not bothersome.  She has difficulty with walking and occasionally uses a cane but can mostly walk slowly.  She has trouble climbing steps because that hurts her back a lot and her legs are weak and she avoids them.  She also complains of mild posterior neck pain and slight restriction of neck movements due to pain.  Of late she has noticed some difficulty swallowing pills as well as some occasional stomach and abdominal pain.  She has family Struve stroke in her mother.  Patient is tolerating aspirin well without side effects.  She states her blood pressure under good control today it is 133/87.  I did not see any recent lipid profile or A1c in the lab work and last echo was in March 2021 which was unremarkable.  ROS:   14 system review of systems is positive for muscle aches, back ache, gait difficulty, imbalance, frequent falls, blurred vision, difficulty swallowing, abdominal pain, speech difficulty all other systems negative PMH:  Past Medical History:  Diagnosis Date  . AAT (alpha-1-antitrypsin) deficiency (HCC)   . Depression   . Dyspnea on exertion   . Enlarged heart   . Hypertension   . Palpitations   . Stroke [facility]) 08/09/2020    Social History:  Social History   Socioeconomic History  . Marital status: Divorced    Spouse name: Not on file  . Number of children: Not on file  . Years of education: Not on file  . Highest education level: Not on file  Occupational History    Comment: full time 10/17/20   Tobacco Use  . Smoking status: Former Smoker    Packs/day: 0.25    Types: Cigarettes    Quit date: 11/19/2018    Years since quitting: 1.9  . Smokeless tobacco: Never Used  . Tobacco comment: social smoking  Vaping Use  . Vaping Use: Never used  Substance and Sexual Activity  . Alcohol use: Yes    Comment: occasionally  . Drug use: No  . Sexual activity: Not on file  Other Topics Concern  . Not on file  Social History Narrative   Lives alone   Right Handed   Drinks no caffeine   Social Determinants of Health   Financial Resource Strain: Not on file  Food Insecurity: Not on file  Transportation Needs: Not on file  Physical Activity: Not on file  Stress: Not on file  Social Connections: Not on file  Intimate Partner Violence: Not on file    Medications:   Current Outpatient Medications on File Prior to Visit  Medication Sig Dispense Refill  . Artificial Saliva (BIOTENE ORALBALANCE DRY MOUTH MT) Use as directed in the mouth or throat 4 (four) times daily.    Marland Kitchen aspirin EC 81 MG tablet Take 1 tablet (81 mg total) by mouth daily. Swallow whole. 30 tablet 0  . atenolol (TENORMIN) 50 MG tablet Take 50 mg by mouth daily.    . diclofenac (VOLTAREN) 75 MG EC tablet Take 75 mg by mouth 2 (two) times daily.    . DULoxetine (CYMBALTA) 20 MG capsule Take 20 mg by mouth daily.    . folic acid (FOLVITE) 1 MG tablet Take 1 mg by mouth daily.    Marland Kitchen gabapentin (NEURONTIN) 300 MG capsule Take 300 mg by mouth daily.    . hydroxychloroquine (PLAQUENIL) 200 MG tablet Take 400 mg by mouth daily. Pt states 400 mg once daily    . leflunomide (ARAVA) 20 MG tablet Take 20 mg by mouth daily.    Marland Kitchen lisinopril (ZESTRIL) 10 MG tablet Take 10 mg by mouth daily.    . methocarbamol (ROBAXIN) 500 MG tablet Take 750 mg by mouth in the morning and at bedtime.     No current facility-administered medications on file prior to visit.    Allergies:   Allergies  Allergen Reactions  . Codeine Nausea And  Vomiting  . Penicillins Rash  . Sulfa Drugs Cross Reactors Rash    Physical Exam General: well developed, well nourished middle-aged Caucasian lady, seated, in no evident distress Head: head normocephalic and atraumatic.   Neck: supple with no carotid or supraclavicular bruits Cardiovascular: regular rate and rhythm, no murmurs Musculoskeletal: no deformity Skin:  no rash/petichiae Vascular:  Normal pulses all extremities  Neurologic Exam Mental Status: Awake and fully alert. Oriented to place and time. Recent and remote memory intact. Attention span, concentration and fund of knowledge appropriate. Mood and affect appropriate.  Cranial Nerves: Fundoscopic exam reveals  sharp disc margins. Pupils equal, briskly reactive to light. Extraocular movements full without nystagmus. Visual fields full to confrontation. Hearing intact. Facial sensation intact. Face, tongue, palate moves normally and symmetrically.  Motor: Normal bulk and tone. Normal strength in all tested extremity muscles. Sensory.: intact to touch , pinprick , position   Sensation but diminished vibration sensation in both feet from ankle down symmetrically..  Coordination: Rapid alternating movements normal in all extremities. Finger-to-nose and heel-to-shin performed accurately bilaterally.  Slightly unsteady while standing on either foot unsupported. Gait and Station: Arises from chair without difficulty. Stance is normal. Gait demonstrates normal stride length but favors the back due to pain..  Unable to heel, toe and tandem walk without difficulty.  Reflexes: 2+ brisk and symmetric. Toes downgoing.   NIHSS  0 Modified Rankin  1   ASSESSMENT: 60 year old Caucasian lady with transient episode of slurred speech and gait ataxia in December 2021 likely of posterior circulation TIA from small vessel disease.  She also has frequent falls which is likely multifactorial due to combination of underlying peripheral neuropathy from her  autoimmune disease as well as arthritis and suspected degenerative spine disease.     PLAN: I had a long discussion the patient with regards to her frequent falls and episode of transient slurred speech, gait ataxia which likely represent a posterior circulation TIA from small vessel disease.  She also likely has underlying peripheral neuropathy or possibly degenerative cervical spine disease explaining her frequent falls.  I recommend she continue aspirin for stroke prevention and maintain aggressive risk factor modification with strict control of hypertension with blood pressure goal below 130/90, lipids with LDL cholesterol goal below 70 mg percent and diabetes with hemoglobin A1c goal below 6.5%.  Check lipid profile, hemoglobin A1c, neuropathy panel labs, echocardiogram, MRI scan of cervical spine and brain and EMG nerve conduction study.  Advised her to get up slowly and avoid sudden and quick movements.  We also discussed fall prevention precautions.  She will return for follow-up in the future in 2 months or call earlier if necessary.  Greater than 50% time during this 45-minute consultation was it was spent in counseling and coordination of care about her TIA and frequent falls and answering questions Delia Heady, MD Note: This document was prepared with digital dictation and possible smart phrase technology. Any transcriptional errors that result from this process are unintentional.

## 2020-10-17 NOTE — Patient Instructions (Signed)
I had a long discussion the patient with regards to her frequent falls and episode of transient slurred speech, gait ataxia which likely represent a posterior circulation TIA from small vessel disease.  She also likely has underlying peripheral neuropathy or possibly degenerative cervical spine disease explaining her frequent falls.  I recommend she continue aspirin for stroke prevention and maintain aggressive risk factor modification with strict control of hypertension with blood pressure goal below 130/90, lipids with LDL cholesterol goal below 70 mg percent and diabetes with hemoglobin A1c goal below 6.5%.  Check lipid profile, hemoglobin A1c, neuropathy panel labs, echocardiogram, MRI scan of cervical spine and brain and EMG nerve conduction study.  Advised her to get up slowly and avoid sudden and quick movements.  We also discussed fall prevention precautions.  She will return for follow-up in the future in 2 months or call earlier if necessary.  Fall Prevention in the Home, Adult Falls can cause injuries and can happen to people of all ages. There are many things you can do to make your home safe and to help prevent falls. Ask for help when making these changes. What actions can I take to prevent falls? General Instructions  Use good lighting in all rooms. Replace any light bulbs that burn out.  Turn on the lights in dark areas. Use night-lights.  Keep items that you use often in easy-to-reach places. Lower the shelves around your home if needed.  Set up your furniture so you have a clear path. Avoid moving your furniture around.  Do not have throw rugs or other things on the floor that can make you trip.  Avoid walking on wet floors.  If any of your floors are uneven, fix them.  Add color or contrast paint or tape to clearly mark and help you see: ? Grab bars or handrails. ? First and last steps of staircases. ? Where the edge of each step is.  If you use a stepladder: ? Make sure  that it is fully opened. Do not climb a closed stepladder. ? Make sure the sides of the stepladder are locked in place. ? Ask someone to hold the stepladder while you use it.  Know where your pets are when moving through your home. What can I do in the bathroom?  Keep the floor dry. Clean up any water on the floor right away.  Remove soap buildup in the tub or shower.  Use nonskid mats or decals on the floor of the tub or shower.  Attach bath mats securely with double-sided, nonslip rug tape.  If you need to sit down in the shower, use a plastic, nonslip stool.  Install grab bars by the toilet and in the tub and shower. Do not use towel bars as grab bars.      What can I do in the bedroom?  Make sure that you have a light by your bed that is easy to reach.  Do not use any sheets or blankets for your bed that hang to the floor.  Have a firm chair with side arms that you can use for support when you get dressed. What can I do in the kitchen?  Clean up any spills right away.  If you need to reach something above you, use a step stool with a grab bar.  Keep electrical cords out of the way.  Do not use floor polish or wax that makes floors slippery. What can I do with my stairs?  Do not leave  any items on the stairs.  Make sure that you have a light switch at the top and the bottom of the stairs.  Make sure that there are handrails on both sides of the stairs. Fix handrails that are broken or loose.  Install nonslip stair treads on all your stairs.  Avoid having throw rugs at the top or bottom of the stairs.  Choose a carpet that does not hide the edge of the steps on the stairs.  Check carpeting to make sure that it is firmly attached to the stairs. Fix carpet that is loose or worn. What can I do on the outside of my home?  Use bright outdoor lighting.  Fix the edges of walkways and driveways and fix any cracks.  Remove anything that might make you trip as you  walk through a door, such as a raised step or threshold.  Trim any bushes or trees on paths to your home.  Check to see if handrails are loose or broken and that both sides of all steps have handrails.  Install guardrails along the edges of any raised decks and porches.  Clear paths of anything that can make you trip, such as tools or rocks.  Have leaves, snow, or ice cleared regularly.  Use sand or salt on paths during winter.  Clean up any spills in your garage right away. This includes grease or oil spills. What other actions can I take?  Wear shoes that: ? Have a low heel. Do not wear high heels. ? Have rubber bottoms. ? Feel good on your feet and fit well. ? Are closed at the toe. Do not wear open-toe sandals.  Use tools that help you move around if needed. These include: ? Canes. ? Walkers. ? Scooters. ? Crutches.  Review your medicines with your doctor. Some medicines can make you feel dizzy. This can increase your chance of falling. Ask your doctor what else you can do to help prevent falls. Where to find more information  Centers for Disease Control and Prevention, STEADI: FootballExhibition.com.br  [employer] on Aging: https://walker.com/ Contact a doctor if:  You are afraid of falling at home.  You feel weak, drowsy, or dizzy at home.  You fall at home. Summary  There are many simple things that you can do to make your home safe and to help prevent falls.  Ways to make your home safe include removing things that can make you trip and installing grab bars in the bathroom.  Ask for help when making these changes in your home. This information is not intended to replace advice given to you by your health care provider. Make sure you discuss any questions you have with your health care provider. Document Revised: 03/02/2020 Document Reviewed: 03/02/2020 Elsevier Patient Education  2021 Elsevier Inc.  Stroke Prevention Some medical conditions and behaviors are  associated with a higher chance of having a stroke. You can help prevent a stroke by making nutrition, lifestyle, and other changes, including managing any medical conditions you may have. What nutrition changes can be made?  Eat healthy foods. You can do this by: ? Choosing foods high in fiber, such as fresh fruits and vegetables and whole grains. ? Eating at least 5 or more servings of fruits and vegetables a day. Try to fill half of your plate at each meal with fruits and vegetables. ? Choosing lean protein foods, such as lean cuts of meat, poultry without skin, fish, tofu, beans, and nuts. ? Eating low-fat  dairy products. ? Avoiding foods that are high in salt (sodium). This can help lower blood pressure. ? Avoiding foods that have saturated fat, trans fat, and cholesterol. This can help prevent high cholesterol. ? Avoiding processed and premade foods.  Follow your health care provider's specific guidelines for losing weight, controlling high blood pressure (hypertension), lowering high cholesterol, and managing diabetes. These may include: ? Reducing your daily calorie intake. ? Limiting your daily sodium intake to 1,500 milligrams (mg). ? Using only healthy fats for cooking, such as olive oil, canola oil, or sunflower oil. ? Counting your daily carbohydrate intake.   What lifestyle changes can be made?  Maintain a healthy weight. Talk to your health care provider about your ideal weight.  Get at least 30 minutes of moderate physical activity at least 5 days a week. Moderate activity includes brisk walking, biking, and swimming.  Do not use any products that contain nicotine or tobacco, such as cigarettes and e-cigarettes. If you need help quitting, ask your health care provider. It may also be helpful to avoid exposure to secondhand smoke.  Limit alcohol intake to no more than 1 drink a day for nonpregnant women and 2 drinks a day for men. One drink equals 12 oz of beer, 5 oz of wine,  or 1 oz of hard liquor.  Stop any illegal drug use.  Avoid taking birth control pills. Talk to your health care provider about the risks of taking birth control pills if: ? You are over 54 years old. ? You smoke. ? You get migraines. ? You have ever had a blood clot. What other changes can be made?  Manage your cholesterol levels. ? Eating a healthy diet is important for preventing high cholesterol. If cholesterol cannot be managed through diet alone, you may also need to take medicines. ? Take any prescribed medicines to control your cholesterol as told by your health care provider.  Manage your diabetes. ? Eating a healthy diet and exercising regularly are important parts of managing your blood sugar. If your blood sugar cannot be managed through diet and exercise, you may need to take medicines. ? Take any prescribed medicines to control your diabetes as told by your health care provider.  Control your hypertension. ? To reduce your risk of stroke, try to keep your blood pressure below 130/80. ? Eating a healthy diet and exercising regularly are an important part of controlling your blood pressure. If your blood pressure cannot be managed through diet and exercise, you may need to take medicines. ? Take any prescribed medicines to control hypertension as told by your health care provider. ? Ask your health care provider if you should monitor your blood pressure at home. ? Have your blood pressure checked every year, even if your blood pressure is normal. Blood pressure increases with age and some medical conditions.  Get evaluated for sleep disorders (sleep apnea). Talk to your health care provider about getting a sleep evaluation if you snore a lot or have excessive sleepiness.  Take over-the-counter and prescription medicines only as told by your health care provider. Aspirin or blood thinners (antiplatelets or anticoagulants) may be recommended to reduce your risk of forming blood  clots that can lead to stroke.  Make sure that any other medical conditions you have, such as atrial fibrillation or atherosclerosis, are managed. What are the warning signs of a stroke? The warning signs of a stroke can be easily remembered as BEFAST.  B is for balance. Signs include: ?  Dizziness. ? Loss of balance or coordination. ? Sudden trouble walking.  E is for eyes. Signs include: ? A sudden change in vision. ? Trouble seeing.  F is for face. Signs include: ? Sudden weakness or numbness of the face. ? The face or eyelid drooping to one side.  A is for arms. Signs include: ? Sudden weakness or numbness of the arm, usually on one side of the body.  S is for speech. Signs include: ? Trouble speaking (aphasia). ? Trouble understanding.  T is for time. ? These symptoms may represent a serious problem that is an emergency. Do not wait to see if the symptoms will go away. Get medical help right away. Call your local emergency services (911 in the [place] Do not drive yourself to the hospital.  Other signs of stroke may include: ? A sudden, severe headache with no known cause. ? Nausea or vomiting. ? Seizure. Where to find more information For more information, visit:  American Stroke Association: www.strokeassociation.org  National Stroke Association: www.stroke.org Summary  You can prevent a stroke by eating healthy, exercising, not smoking, limiting alcohol intake, and managing any medical conditions you may have.  Do not use any products that contain nicotine or tobacco, such as cigarettes and e-cigarettes. If you need help quitting, ask your health care provider. It may also be helpful to avoid exposure to secondhand smoke.  Remember BEFAST for warning signs of stroke. Get help right away if you or a loved one has any of these signs. This information is not intended to replace advice given to you by your health care provider. Make sure you discuss any questions you  have with your health care provider. Document Revised: 07/12/2017 Document Reviewed: 09/04/2016 Elsevier Patient Education  2021 [employer].

## 2020-10-18 ENCOUNTER — Encounter: Payer: Self-pay | Admitting: *Deleted

## 2020-10-18 ENCOUNTER — Telehealth: Payer: Self-pay | Admitting: Neurology

## 2020-10-18 NOTE — Telephone Encounter (Signed)
schedule for 3/16 130PM  [employer] auth: [id number] (exp. 10/17/20 to 11/15/20) order faxed to [facility] they will reach out to the patient to schedule

## 2020-10-18 NOTE — Progress Notes (Signed)
Kindly inform the patient that all lab work for treatable cause of neuropathy is not back yet but vitamin D levels are quite low and she needs to see a primary care physician to start treatment for vitamin D replacement.

## 2020-10-19 LAB — HEMOGLOBIN A1C
Est. average glucose Bld gHb Est-mCnc: 103 mg/dL
Hgb A1c MFr Bld: 5.2 % (ref 4.8–5.6)

## 2020-10-19 LAB — NEUROPATHY PANEL
A/G Ratio: 1.5 (ref 0.7–1.7)
Albumin ELP: 4 g/dL (ref 2.9–4.4)
Alpha 1: 0.2 g/dL (ref 0.0–0.4)
Alpha 2: 0.6 g/dL (ref 0.4–1.0)
Angio Convert Enzyme: 16 U/L (ref 14–82)
Anti Nuclear Antibody (ANA): POSITIVE — AB
Beta: 1.1 g/dL (ref 0.7–1.3)
Gamma Globulin: 0.8 g/dL (ref 0.4–1.8)
Globulin, Total: 2.6 g/dL (ref 2.2–3.9)
Rheumatoid fact SerPl-aCnc: <10 IU/mL (ref <14.0)
Sed Rate: 2 mm/hr (ref 0–40)
TSH: 2.4 u[IU]/mL (ref 0.450–4.500)
Total Protein: 6.6 g/dL (ref 6.0–8.5)
Vit D, 25-Hydroxy: 18.3 ng/mL — ABNORMAL LOW (ref 30.0–100.0)
Vitamin B-12: 1000 pg/mL (ref 232–1245)

## 2020-10-19 LAB — LIPID PANEL
Chol/HDL Ratio: 3.6 ratio (ref 0.0–4.4)
Cholesterol, Total: 221 mg/dL — ABNORMAL HIGH (ref 100–199)
HDL: 62 mg/dL (ref >39)
LDL Chol Calc (NIH): 147 mg/dL — ABNORMAL HIGH (ref 0–99)
Triglycerides: 67 mg/dL (ref 0–149)
VLDL Cholesterol Cal: 12 mg/dL (ref 5–40)

## 2020-10-26 ENCOUNTER — Telehealth (HOSPITAL_COMMUNITY): Payer: Self-pay | Admitting: Neurology

## 2020-10-26 ENCOUNTER — Telehealth: Payer: Self-pay | Admitting: Neurology

## 2020-10-26 NOTE — Telephone Encounter (Signed)
Patient called [name] and does not want to have  Echocardiogram now.  See message below.   [name] Hey there, she called me back and does not want to have the echocardiogram. Iwill make note in chart.

## 2020-10-26 NOTE — Telephone Encounter (Signed)
10/26/20 Pt called and does not want to schedule echo and states she had one in March 2021 and does not need this one.  Order will be removed from the Echo WQ. Thank you

## 2020-10-28 NOTE — Telephone Encounter (Signed)
Ok thanks 

## 2020-11-01 DIAGNOSIS — I10 Essential (primary) hypertension: Secondary | ICD-10-CM | POA: Diagnosis not present

## 2020-11-01 DIAGNOSIS — M2550 Pain in unspecified joint: Secondary | ICD-10-CM | POA: Diagnosis not present

## 2020-11-01 DIAGNOSIS — M549 Dorsalgia, unspecified: Secondary | ICD-10-CM | POA: Diagnosis not present

## 2020-11-01 DIAGNOSIS — M069 Rheumatoid arthritis, unspecified: Secondary | ICD-10-CM | POA: Diagnosis not present

## 2020-11-02 ENCOUNTER — Encounter: Payer: BC Managed Care – PPO | Admitting: Neurology

## 2020-11-09 ENCOUNTER — Telehealth: Payer: Self-pay | Admitting: Neurology

## 2020-11-09 DIAGNOSIS — M3500 Sicca syndrome, unspecified: Secondary | ICD-10-CM | POA: Diagnosis not present

## 2020-11-09 DIAGNOSIS — Z79899 Other long term (current) drug therapy: Secondary | ICD-10-CM | POA: Diagnosis not present

## 2020-11-09 DIAGNOSIS — M0609 Rheumatoid arthritis without rheumatoid factor, multiple sites: Secondary | ICD-10-CM | POA: Diagnosis not present

## 2020-11-09 DIAGNOSIS — M791 Myalgia, unspecified site: Secondary | ICD-10-CM | POA: Diagnosis not present

## 2020-11-09 DIAGNOSIS — M797 Fibromyalgia: Secondary | ICD-10-CM | POA: Diagnosis not present

## 2020-11-09 NOTE — Telephone Encounter (Signed)
Irving Burton- this sounds like this is related to the MRI's. Can you you help with this? Thank you

## 2020-11-09 NOTE — Telephone Encounter (Signed)
I did the authorization for the Brain and Cervical it was only 2 exams not 4.   BCBS Berkley Harvey: [id number] (exp. 11/09/20 to 12/08/20) patient is scheduled at [facility] for 11/16/20.  I spoke with Marisue Ivan at [facility] and she is aware of the updated authorization number.

## 2020-11-09 NOTE — Telephone Encounter (Signed)
[facility] Marisue Ivan) called, BCBS will not extend authorization for one day for 4 scans. We need reauthorizations for those 4 scans. She is scheduled 4/6 two appts, 4/7 two appts. Would like a call from the nurse.

## 2020-11-11 ENCOUNTER — Ambulatory Visit: Payer: BC Managed Care – PPO | Attending: Family Medicine

## 2020-11-11 ENCOUNTER — Other Ambulatory Visit: Payer: Self-pay

## 2020-11-11 DIAGNOSIS — M2550 Pain in unspecified joint: Secondary | ICD-10-CM | POA: Diagnosis not present

## 2020-11-11 NOTE — Therapy (Signed)
[facility] Outpatient Rehabilitation [facility] [address] [place], [place], [address] Phone: [phone]   Fax:  [phone]  Physical Therapy Evaluation  Patient Details  Name: See Beharry MRN: [id number] Date of Birth: 1960-12-25 Referring Provider (PT): Farris Has, MD   Encounter Date: 11/11/2020   PT End of Session - 11/11/20 1145    Visit Number 1    Number of Visits 1    PT Start Time 0700    PT Stop Time 1055    PT Time Calculation (min) 235 min    Activity Tolerance Patient limited by fatigue;Patient limited by pain    Behavior During Therapy [facility] for tasks assessed/performed           Past Medical History:  Diagnosis Date  . AAT (alpha-1-antitrypsin) deficiency (HCC)   . Depression   . Dyspnea on exertion   . Enlarged heart   . Hypertension   . Palpitations   . Stroke [facility]) 08/09/2020    Past Surgical History:  Procedure Laterality Date  . APPENDECTOMY    . BREAST ENHANCEMENT SURGERY    . CESAREAN SECTION    . CHOLECYSTECTOMY    . COSMETIC SURGERY      There were no vitals filed for this visit.    Subjective Assessment - 11/11/20 1142    Subjective She reports chronic pain at multiple joints  and a mild CVA this past 07/2021 and reports she is not able to return to work at the same job.              [facility] PT Assessment - 11/11/20 0001      Assessment   Medical Diagnosis Multi joint pain    Referring Provider (PT) Farris Has, MD    Hand Dominance Right    Next MD Visit PRN      Precautions   Precautions None      Restrictions   Weight Bearing Restrictions No      Prior Function   Level of Independence Independent;Needs assistance with homemaking      Cognition   Overall Cognitive Status Within Functional Limits for tasks assessed                      Objective measurements completed on examination: See above findings.               PT Education - 11/11/20 1144    Education  Details Reports was reviewed with pt and she was informed of incr pain possible for 2-4 days post FCE    Person(s) Educated Patient    Methods Explanation    Comprehension Verbalized understanding                       Plan - 11/11/20 1146    Clinical Impression Statement Ms Blaize compleated the FCE with increased pain and rating of light duty but with caution of this may be him minimum level and not max. She did not follow the instructions by pushing through increased pain during some tasks.  Fully flexed positions and  lying did not increase pain but sitting and standing activity did increase pain as testing progressed.    PT Frequency One time visit    PT Next Visit Plan Fax the FCE results to MD    Consulted and Agree with Plan of Care Patient           Patient will benefit from skilled therapeutic intervention in order  to improve the following deficits and impairments:     Visit Diagnosis: Arthralgia, unspecified joint     Problem List Patient Active Problem List   Diagnosis Date Noted  . Alpha-1-antitrypsin deficiency carrier 09/28/2020  . Rheumatoid arthritis (HCC) 09/28/2020  . Essential hypertension 09/29/2015  . Dyspnea on exertion 09/29/2015  . Palpitations 09/29/2015    Caprice Red  PT 11/11/2020, 12:08 PM  [facility] Outpatient Rehabilitation [facility] [address] [place], [place], [address] Phone: [phone]   Fax:  [phone]  Name: Tyrese Ficek MRN: [id number] Date of Birth: 08-24-1960

## 2020-11-17 DIAGNOSIS — R296 Repeated falls: Secondary | ICD-10-CM | POA: Diagnosis not present

## 2020-11-17 DIAGNOSIS — M47812 Spondylosis without myelopathy or radiculopathy, cervical region: Secondary | ICD-10-CM | POA: Diagnosis not present

## 2020-11-17 DIAGNOSIS — M4802 Spinal stenosis, cervical region: Secondary | ICD-10-CM | POA: Diagnosis not present

## 2020-11-21 DIAGNOSIS — M069 Rheumatoid arthritis, unspecified: Secondary | ICD-10-CM | POA: Diagnosis not present

## 2020-11-21 DIAGNOSIS — M2550 Pain in unspecified joint: Secondary | ICD-10-CM | POA: Diagnosis not present

## 2020-11-21 DIAGNOSIS — M3500 Sicca syndrome, unspecified: Secondary | ICD-10-CM | POA: Diagnosis not present

## 2020-11-21 DIAGNOSIS — F411 Generalized anxiety disorder: Secondary | ICD-10-CM | POA: Diagnosis not present

## 2020-11-23 DIAGNOSIS — M797 Fibromyalgia: Secondary | ICD-10-CM | POA: Diagnosis not present

## 2020-11-23 DIAGNOSIS — G894 Chronic pain syndrome: Secondary | ICD-10-CM | POA: Diagnosis not present

## 2020-11-23 DIAGNOSIS — R52 Pain, unspecified: Secondary | ICD-10-CM | POA: Diagnosis not present

## 2020-11-30 ENCOUNTER — Telehealth: Payer: Self-pay | Admitting: Neurology

## 2020-11-30 ENCOUNTER — Other Ambulatory Visit: Payer: Self-pay | Admitting: Pain Medicine

## 2020-11-30 DIAGNOSIS — M545 Low back pain, unspecified: Secondary | ICD-10-CM

## 2020-11-30 DIAGNOSIS — G8929 Other chronic pain: Secondary | ICD-10-CM

## 2020-11-30 DIAGNOSIS — Z0289 Encounter for other administrative examinations: Secondary | ICD-10-CM

## 2020-11-30 NOTE — Telephone Encounter (Signed)
Called Pt and she has given CC info to proceed with payment for disability forms to be completed 4.20.2022

## 2020-12-01 ENCOUNTER — Telehealth: Payer: Self-pay | Admitting: Neurology

## 2020-12-01 DIAGNOSIS — K769 Liver disease, unspecified: Secondary | ICD-10-CM | POA: Diagnosis not present

## 2020-12-01 NOTE — Telephone Encounter (Signed)
[place] form given to Erika King 4.21.2022

## 2020-12-05 ENCOUNTER — Ambulatory Visit (INDEPENDENT_AMBULATORY_CARE_PROVIDER_SITE_OTHER): Payer: BC Managed Care – PPO | Admitting: Neurology

## 2020-12-05 ENCOUNTER — Encounter (INDEPENDENT_AMBULATORY_CARE_PROVIDER_SITE_OTHER): Payer: BC Managed Care – PPO | Admitting: Neurology

## 2020-12-05 DIAGNOSIS — R296 Repeated falls: Secondary | ICD-10-CM | POA: Diagnosis not present

## 2020-12-05 DIAGNOSIS — Z0289 Encounter for other administrative examinations: Secondary | ICD-10-CM

## 2020-12-05 NOTE — Procedures (Signed)
Full Name: [name] Gender: Female MRN #: [id number] Date of Birth: Nov 07, 1960    Visit Date: 12/05/2020 09:23 Age: 60 Years Examining Physician: Levert Feinstein, MD  Referring Physician: Delia Heady, MD History: 60 year old female presented with intermittent gait abnormality On examination, bilateral upper and lower extremity motor strength is normal.  Sensory was intact to light touch and vibratory sensation.  Deep tendon reflexes were present and symmetric.  Summary of the test: Nerve conduction study: Bilateral sural, superficial peroneal sensory responses were normal.  Bilateral peroneal to EDB and tibial motor responses were normal  Electromyography: Selected needle examination of bilateral lower extremity muscles and bilateral lumbosacral paraspinal muscles were normal.  Conclusion: This is a normal study.  There is NO electrodiagnostic evidence of large fiber peripheral neuropathy, bilateral lumbosacral radiculopathy or intrinsic muscle disease.    ------------------------------- Levert Feinstein M.D. PhD  [facility] [address], [address] [place], [place] [address] Tel: [phone] Fax: [phone]  Verbal informed consent was obtained from the patient, patient was informed of potential risk of procedure, including bruising, bleeding, hematoma formation, infection, muscle weakness, muscle pain, numbness, among others.        MNC    Nerve / Sites Muscle Latency Ref. Amplitude Ref. Rel Amp Segments Distance Velocity Ref. Area    ms ms mV mV %  cm m/s m/s mVms  L Peroneal - EDB     Ankle EDB 4.5 ?6.5 3.6 ?2.0 100 Ankle - EDB 9   13.5     Fib head EDB 11.0  3.2  90.2 Fib head - Ankle 29 44 ?44 13.0     Pop fossa EDB 13.3  2.9  88.7 Pop fossa - Fib head 10 44 ?44 11.3         Pop fossa - Ankle      R Peroneal - EDB     Ankle EDB 4.5 ?6.5 3.5 ?2.0 100 Ankle - EDB 9   12.8     Fib head EDB 11.0  2.6  73.5 Fib head - Ankle 29 45 ?44 11.6      Pop fossa EDB 13.2  2.5  96.1 Pop fossa - Fib head 10 44 ?44 13.0         Pop fossa - Ankle      L Tibial - AH     Ankle AH 3.9 ?5.8 8.1 ?4.0 100 Ankle - AH 9   24.8     Pop fossa AH 11.9  8.0  98.2 Pop fossa - Ankle 35 44 ?41 26.7  R Tibial - AH     Ankle AH 3.7 ?5.8 11.0 ?4.0 100 Ankle - AH 9   24.0     Pop fossa AH 12.7  7.4  67.5 Pop fossa - Ankle 37 41 ?41 27.3             SNC    Nerve / Sites Rec. Site Peak Lat Ref.  Amp Ref. Segments Distance    ms ms V V  cm  L Sural - Ankle (Calf)     Calf Ankle 3.4 ?4.4 14 ?6 Calf - Ankle 14  R Sural - Ankle (Calf)     Calf Ankle 3.8 ?4.4 15 ?6 Calf - Ankle 14  L Superficial peroneal - Ankle     Lat leg Ankle 3.9 ?4.4 6 ?6 Lat leg - Ankle 14  R Superficial peroneal - Ankle     Lat leg Ankle 3.9 ?  4.4 8 ?6 Lat leg - Ankle 14             F  Wave    Nerve F Lat Ref.   ms ms  L Tibial - AH 53.1 ?56.0  R Tibial - AH 55.7 ?56.0         EMG Summary Table    Spontaneous MUAP Recruitment  Muscle IA Fib PSW Fasc Other Amp Dur. Poly Pattern  L. Tibialis anterior Normal None None None _______ Normal Normal Normal Normal  L. Tibialis posterior Normal None None None _______ Normal Normal Normal Normal  L. Peroneus longus Normal None None None _______ Normal Normal Normal Normal  L. Gastrocnemius (Medial head) Normal None None None _______ Normal Normal Normal Normal  L. Vastus lateralis Normal None None None _______ Normal Normal Normal Normal  R. Tibialis anterior Normal None None None _______ Normal Normal Normal Normal  R. Tibialis posterior Normal None None None _______ Normal Normal Normal Normal  R. Peroneus longus Normal None None None _______ Normal Normal Normal Normal  R. Gastrocnemius (Medial head) Normal None None None _______ Normal Normal Normal Normal  R. Vastus lateralis Normal None None None _______ Normal Normal Normal Normal  R. Lumbar paraspinals (low) Normal None None None _______ Normal Normal Normal Normal  R. Lumbar  paraspinals (mid) Normal None None None _______ Normal Normal Normal Normal  L. Lumbar paraspinals (low) Normal None None None _______ Normal Normal Normal Normal  L. Lumbar paraspinals (mid) Normal None None None _______ Normal Normal Normal Normal

## 2020-12-16 ENCOUNTER — Other Ambulatory Visit: Payer: Self-pay

## 2020-12-16 NOTE — Progress Notes (Signed)
Kindly inform the patient that nerve conduction study was normal and did not show any evidence of pinched nerve or muscle damage

## 2020-12-19 ENCOUNTER — Telehealth: Payer: Self-pay | Admitting: Neurology

## 2020-12-19 ENCOUNTER — Encounter: Payer: Self-pay | Admitting: Neurology

## 2020-12-19 NOTE — Telephone Encounter (Signed)
-----   Message from Micki Riley, MD sent at 12/16/2020  9:53 AM EDT ----- Joneen Roach inform the patient that nerve conduction study was normal and did not show any evidence of pinched nerve or muscle damage

## 2020-12-19 NOTE — Telephone Encounter (Signed)
Called the patient to review nerve conduction study results.  There was no answer.  Left a detailed message advising of the normal findings.  Instructed the patient to call back with any questions or concerns.  Also advised I would send a MyChart message.  If the patient returns the call, please make sure she is aware of the nerve conduction study results Dr. Pearlean Brownie reviewed showed there was no evidence of a pinched nerve and no evidence of muscle damage.

## 2020-12-28 ENCOUNTER — Telehealth: Payer: Self-pay | Admitting: *Deleted

## 2020-12-28 ENCOUNTER — Telehealth: Payer: Self-pay | Admitting: Emergency Medicine

## 2020-12-28 NOTE — Telephone Encounter (Signed)
STD/[place] paperwork completed, signed by Dr. Pearlean Brownie.  Given to Stanton Kidney in MR.

## 2020-12-28 NOTE — Telephone Encounter (Signed)
I faxed pt [place] form on 12/28/20

## 2021-01-03 ENCOUNTER — Other Ambulatory Visit: Payer: BC Managed Care – PPO

## 2021-01-11 IMAGING — CT CT ANGIO NECK
1 of 12 series · 5 of 33 positions shown · IV contrast (OMNI 350)
Comparison: CT head 08/09/2020

CLINICAL DATA: Multiple falls

EXAM:
CT ANGIOGRAPHY HEAD AND NECK
TECHNIQUE: Multidetector CT imaging of the head and neck was performed using
the standard protocol during bolus administration of intravenous
contrast. Multiplanar CT image reconstructions and MIPs were
obtained to evaluate the vascular anatomy. Carotid stenosis
measurements (when applicable) are obtained utilizing NASCET
criteria, using the distal internal carotid diameter as the
denominator.
CONTRAST:  75mL OMNIPAQUE IOHEXOL 350 MG/ML SOLN

[Series 11: cta neck thins · axial · 0.40mm/px · z∈[-280,-50]mm · 5 of 859 slices shown]
[im 144/859  soft-tissue]
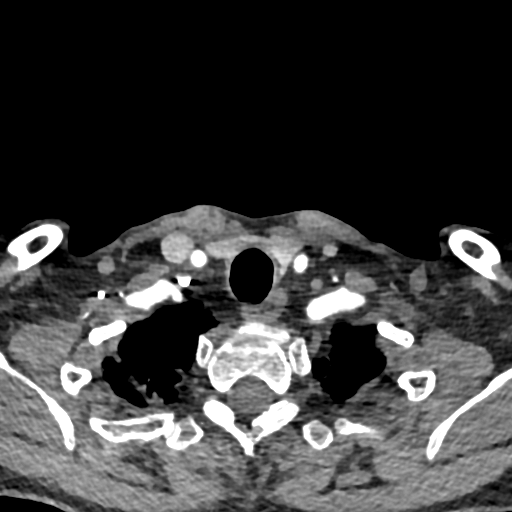
[im 287/859  bone]
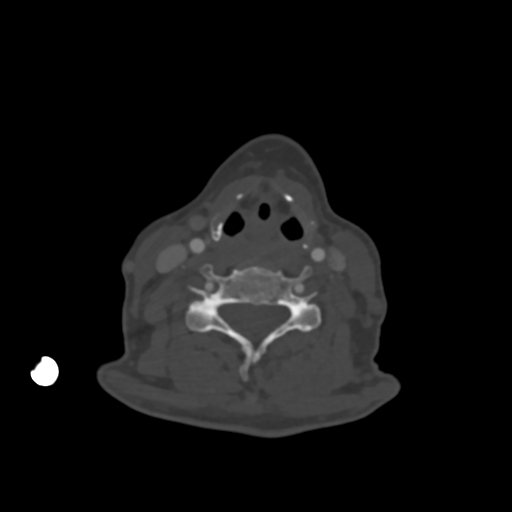
[im 430/859  soft-tissue]
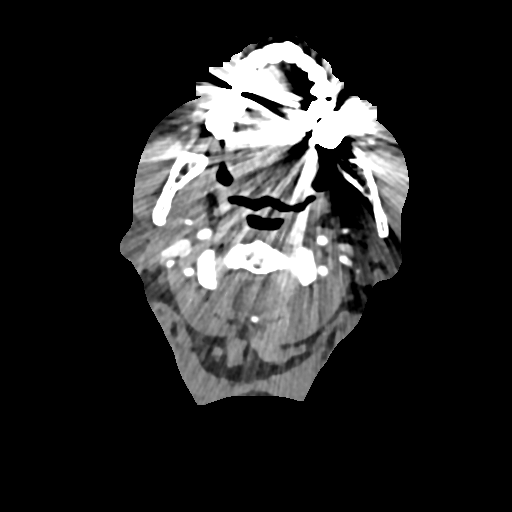
[im 573/859  bone]
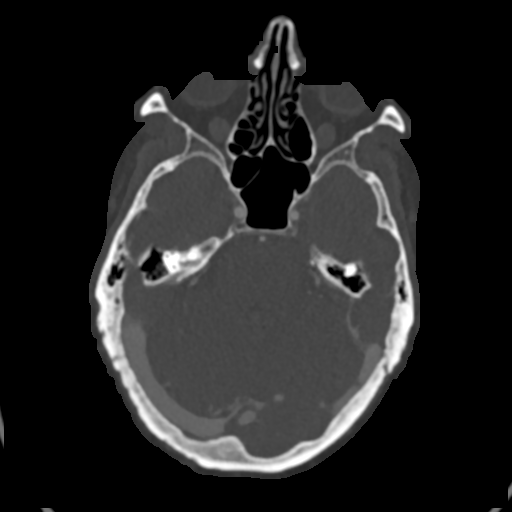
[im 716/859  soft-tissue]
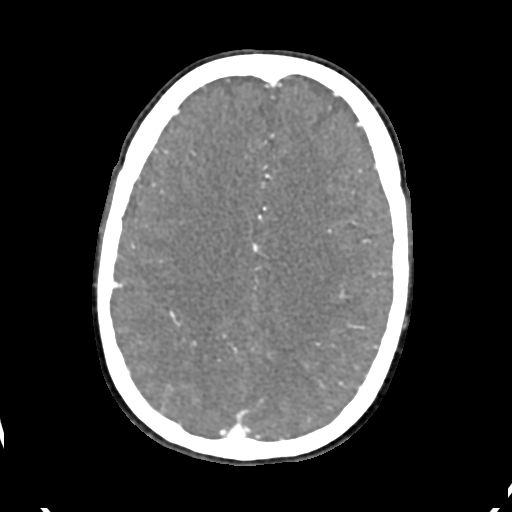

[5 of 33 positions shown; findings below may reference images not displayed]

FINDINGS: CT HEAD

Brain: There is no acute intracranial hemorrhage, mass effect, or
edema. Gray-white differentiation is preserved. There is no
extra-axial fluid collection. Ventricles and sulci are within normal
limits in size and configuration.

Vascular: No hyperdense vessel or unexpected calcification.

Skull: Calvarium is unremarkable.

Sinuses/Orbits: No acute finding.

Other: None.

Review of the MIP images confirms the above findings

CTA NECK

Aortic arch: Great vessel origins are patent.

Right carotid system: Patent.  No measurable stenosis.

Left carotid system: Patent.  No measurable stenosis.

Vertebral arteries: Patent and codominant. No measurable stenosis or
evidence of dissection.

Skeleton: Degenerative changes of the cervical spine.

Other neck: No mass or adenopathy.

Upper chest: No apical lung mass.

Review of the MIP images confirms the above findings

CTA HEAD

Anterior circulation: Intracranial internal carotid arteries are
patent. Anterior and middle cerebral arteries are patent.

Posterior circulation: Intracranial vertebral arteries, basilar
artery, and posterior cerebral arteries are patent. A right
posterior communicating artery is identified.

Venous sinuses: Patent as allowed by contrast bolus timing.

Review of the MIP images confirms the above findings
IMPRESSION: No acute intracranial abnormality.

No large vessel occlusion or hemodynamically significant stenosis.
No evidence of vertebral artery dissection.

## 2021-01-19 DIAGNOSIS — R1013 Epigastric pain: Secondary | ICD-10-CM | POA: Diagnosis not present

## 2021-01-19 DIAGNOSIS — I10 Essential (primary) hypertension: Secondary | ICD-10-CM | POA: Diagnosis not present

## 2021-01-19 DIAGNOSIS — E559 Vitamin D deficiency, unspecified: Secondary | ICD-10-CM | POA: Diagnosis not present

## 2021-01-19 DIAGNOSIS — Z13220 Encounter for screening for lipoid disorders: Secondary | ICD-10-CM | POA: Diagnosis not present

## 2021-01-19 DIAGNOSIS — M069 Rheumatoid arthritis, unspecified: Secondary | ICD-10-CM | POA: Diagnosis not present

## 2021-01-19 DIAGNOSIS — Z0000 Encounter for general adult medical examination without abnormal findings: Secondary | ICD-10-CM | POA: Diagnosis not present

## 2021-01-23 ENCOUNTER — Other Ambulatory Visit: Payer: Self-pay

## 2021-01-23 ENCOUNTER — Ambulatory Visit
Admission: RE | Admit: 2021-01-23 | Discharge: 2021-01-23 | Disposition: A | Discharge status: Home / Self Care | Payer: BC Managed Care – PPO | Source: Ambulatory Visit | Attending: Pain Medicine | Admitting: Pain Medicine

## 2021-01-23 DIAGNOSIS — M545 Low back pain, unspecified: Secondary | ICD-10-CM | POA: Diagnosis not present

## 2021-01-23 DIAGNOSIS — M48061 Spinal stenosis, lumbar region without neurogenic claudication: Secondary | ICD-10-CM | POA: Diagnosis not present

## 2021-01-25 ENCOUNTER — Ambulatory Visit (INDEPENDENT_AMBULATORY_CARE_PROVIDER_SITE_OTHER): Payer: BC Managed Care – PPO | Admitting: Neurology

## 2021-01-25 ENCOUNTER — Telehealth: Payer: Self-pay | Admitting: Neurology

## 2021-01-25 ENCOUNTER — Encounter: Payer: Self-pay | Admitting: Neurology

## 2021-01-25 VITALS — BP 141/84 | HR 77 | Ht 65.0 in | Wt 153.4 lb

## 2021-01-25 DIAGNOSIS — R42 Dizziness and giddiness: Secondary | ICD-10-CM | POA: Diagnosis not present

## 2021-01-25 DIAGNOSIS — R52 Pain, unspecified: Secondary | ICD-10-CM | POA: Diagnosis not present

## 2021-01-25 DIAGNOSIS — G894 Chronic pain syndrome: Secondary | ICD-10-CM | POA: Diagnosis not present

## 2021-01-25 DIAGNOSIS — M545 Low back pain, unspecified: Secondary | ICD-10-CM

## 2021-01-25 DIAGNOSIS — M47817 Spondylosis without myelopathy or radiculopathy, lumbosacral region: Secondary | ICD-10-CM | POA: Diagnosis not present

## 2021-01-25 DIAGNOSIS — M48061 Spinal stenosis, lumbar region without neurogenic claudication: Secondary | ICD-10-CM | POA: Insufficient documentation

## 2021-01-25 DIAGNOSIS — M549 Dorsalgia, unspecified: Secondary | ICD-10-CM | POA: Insufficient documentation

## 2021-01-25 DIAGNOSIS — R269 Unspecified abnormalities of gait and mobility: Secondary | ICD-10-CM | POA: Diagnosis not present

## 2021-01-25 DIAGNOSIS — M5440 Lumbago with sciatica, unspecified side: Secondary | ICD-10-CM

## 2021-01-25 DIAGNOSIS — M797 Fibromyalgia: Secondary | ICD-10-CM | POA: Diagnosis not present

## 2021-01-25 DIAGNOSIS — G8929 Other chronic pain: Secondary | ICD-10-CM

## 2021-01-25 NOTE — Patient Instructions (Signed)
I had a long discussion with the patient regarding complaints of lightheadedness and dizziness which appears to be of unclear etiology but recommend further evaluation with checking CT angiogram of the brain and neck to rule out significant obstructive vascular stenosis.  We also discussed results of her lab work and EMG nerve conduction study I recommend she continue vitamin D replacement.  She is cleared significant disability from her back pain and spinal stenosis and has been unable to tolerate epidural injections or pain medication since recommend referral to neurosurgery to consider surgery.  Refer to Dr. Venetia Maxon from [facility].  She will return for follow-up in the future in 3 months or call earlier if necessary.

## 2021-01-25 NOTE — Progress Notes (Signed)
[facility] [address] [place]. [place] [address] [phone]       OFFICE FOLLOW UP VISITT NOTE  [name] Date of Birth:  06/30/1961 Medical Record Number:  [id number]   Referring MD: Sherwood Gambler  Reason for Referral: TIA  HPI: Initial visit 10/17/2020 :[name] is a pleasant 60 year old Caucasian lady with past medical history of syndrome, rheumatoid arthritis, hypertension, chronic pain who is seen today for initial office consultation visit for TIA and frequent falls..  History is obtained from the patient, review of electronic medical records and I personally reviewed available imaging films in PACS.  Patient's states that on 08/09/2020 she felt dizzy off balance and was leaning to one side.  She denied true vertigo but could not walk straight and had to hold onto the walls.  She went to the bathroom and she fell backwards.  She states the tub broke her fall and she hit her neck and shoulders.  She did not hit her head.  She did not lose consciousness.  She had another fall the next day morning and hence called her sister and sister noted that her speech was not clear.  Patient felt that her tongue was thick and she had some trouble getting her words out.  She had a mild headache but it was not bothersome.  She was seen in the ER blood pressure slightly elevated 180/113.  Symptoms had resolved by that time.  CT scan of the head was obtained which I personally reviewed showed no acute abnormality and CT angiogram of the brain and neck also did not reveal significant large vessel extracranial intracranial stenosis.  CBC was unremarkable and basic metabolic panel level significant only for a slightly low potassium of 3.4.  EKG was unremarkable.  Patient was started on aspirin.  MRI was discussed with patient vehemently refused MRI due to claustrophobia.  Patient states she has had no prior history of strokes or TIAs.  She does have history however of frequent  falls.  She describes these as happening without warning.  Mostly she falls backwards.  She is never hit her head or lost consciousness.  She denies any vertigo, headache, palpitations, chest tightness or sweating prior to these falls.  She states she has lots of back pain and body aches due to her Sjogren's syndrome and rheumatoid arthritis.  She does see a rheumatologist Dr. Kathlene November for the same.  She also complains of tingling and numbness in the hands and feet which is longstanding but this is not getting worse and is not bothersome.  She has difficulty with walking and occasionally uses a cane but can mostly walk slowly.  She has trouble climbing steps because that hurts her back a lot and her legs are weak and she avoids them.  She also complains of mild posterior neck pain and slight restriction of neck movements due to pain.  Of late she has noticed some difficulty swallowing pills as well as some occasional stomach and abdominal pain.  She has family Struve stroke in her mother.  Patient is tolerating aspirin well without side effects.  She states her blood pressure under good control today it is 133/87.  I did not see any recent lipid profile or A1c in the lab work and last echo was in March 2021 which was unremarkable. Update 01/25/2021 : She returns for follow-up after last visit 3 months ago.  Patient is complaining of lightheadedness pulse mostly in the mornings when she first wakes up.  This risk and at times is severe that she can fall down and has to catch her self.  Today she had of occasional when she was walking from the bed to the bathroom and she almost fell and had to catch her self.  She denies any accompanying vertigo, blurred vision, double vision focal extremity weakness or numbness.  She had EMG nerve conduction study done on 12/05/2020 which was normal without evidence of neuropathy, myopathy or radiculopathy.  At last visit she had lab work on 10/17/2020 which showed LDL cholesterol elevated  at 147, total cholesterol 221, HDL 62 and triglycerides 67 mg percent.  Globin A1c was 5.2.  Vitamin D was low at 18.3 which has since been replaced by primary care physician and follow-up vitamin D level was in the normal range at 39.9.  Rheumatoid factor, angiotensin-converting enzyme levels and ESR were normal ANA was positive.  She recently underwent MRI scan lumbar spine on 01/23/2021 which showed focal disc protrusion to the left at L4-5 but no significant compression. Also 9 mm anterior listhesis at L5-S1 with severe moderate bilateral foraminal narrowing with severe facet degeneration at L5-S1.  She also underwent MRI scan of the brain done at [facility] on 11/17/2020 which was normal and MRI scan of the cervical spine showed mild disc degenerative changes with moderate bilateral foraminal narrowing right greater than left at C5-6.  Patient states her biggest difficulty now is of back and right sided leg pain and she has to walk with splinting her back in she is significantly disabled and quality of life is very poor.  She is being seen at the pain clinic and she had 1 injection epidural which actually did not help and cause more problems.  She does not like to take strong pain medications.  She had previously been quite reluctant to discuss surgical treatment options but is now willing to see a spine surgeon to see if she wants surgery. ROS:   14 system review of systems is positive for muscle aches, back ache, gait difficulty, imbalance, frequent falls, blurred vision, difficulty swallowing, abdominal pain, speech difficulty all other systems negative PMH:  Past Medical History:  Diagnosis Date   AAT (alpha-1-antitrypsin) deficiency (HCC)    Depression    Dyspnea on exertion    Enlarged heart    Hypertension    Palpitations    Stroke ([place]) 08/09/2020    Social History:  Social History   Socioeconomic History   Marital status: Divorced    Spouse name: Not on file   Number of children:  Not on file   Years of education: Not on file   Highest education level: Not on file  Occupational History    Comment: full time 10/17/20  Tobacco Use   Smoking status: Former    Packs/day: 0.25    Pack years: 0.00    Types: Cigarettes    Quit date: 11/19/2018    Years since quitting: 2.1   Smokeless tobacco: Never   Tobacco comments:    social smoking  Vaping Use   Vaping Use: Never used  Substance and Sexual Activity   Alcohol use: Yes    Comment: occasionally   Drug use: No   Sexual activity: Not on file  Other Topics Concern   Not on file  Social History Narrative   Lives alone   Right Handed   Drinks no caffeine   Social Determinants of Health   Financial Resource Strain: Not on file  Food Insecurity: Not  on file  Transportation Needs: Not on file  Physical Activity: Not on file  Stress: Not on file  Social Connections: Not on file  Intimate Partner Violence: Not on file    Medications:   Current Outpatient Medications on File Prior to Visit  Medication Sig Dispense Refill   Artificial Saliva (BIOTENE ORALBALANCE DRY MOUTH MT) Use as directed in the mouth or throat 4 (four) times daily.     aspirin EC 81 MG tablet Take 1 tablet (81 mg total) by mouth daily. Swallow whole. 30 tablet 0   atenolol (TENORMIN) 50 MG tablet Take 50 mg by mouth daily.     diclofenac (VOLTAREN) 75 MG EC tablet Take 75 mg by mouth 2 (two) times daily.     DULoxetine (CYMBALTA) 30 MG capsule Take 30 mg by mouth daily.     folic acid (FOLVITE) 1 MG tablet Take 1 mg by mouth daily.     gabapentin (NEURONTIN) 300 MG capsule Take 300 mg by mouth daily.     hydroxychloroquine (PLAQUENIL) 200 MG tablet Take 400 mg by mouth daily. Pt states 400 mg once daily     leflunomide (ARAVA) 10 MG tablet Take 10 mg by mouth daily.     lisinopril (ZESTRIL) 10 MG tablet Take 10 mg by mouth daily.     methocarbamol (ROBAXIN) 750 MG tablet Take 750 mg by mouth in the morning and at bedtime.     No current  facility-administered medications on file prior to visit.    Allergies:   Allergies  Allergen Reactions   Codeine Nausea And Vomiting   Penicillins Rash   Sulfa Drugs Cross Reactors Rash    Physical Exam General: well developed, well nourished middle-aged Caucasian lady, seated, in no evident distress Head: head normocephalic and atraumatic.   Neck: supple with no carotid or supraclavicular bruits Cardiovascular: regular rate and rhythm, no murmurs Musculoskeletal: no deformity Skin:  no rash/petichiae Vascular:  Normal pulses all extremities  Neurologic Exam Mental Status: Awake and fully alert. Oriented to place and time. Recent and remote memory intact. Attention span, concentration and fund of knowledge appropriate. Mood and affect appropriate.  Cranial Nerves: Fundoscopic exam reveals sharp disc margins. Pupils equal, briskly reactive to light. Extraocular movements full without nystagmus. Visual fields full to confrontation. Hearing intact. Facial sensation intact. Face, tongue, palate moves normally and symmetrically.  Motor: Normal bulk and tone. Normal strength in all tested extremity muscles. Sensory.: intact to touch , pinprick , position   Sensation but diminished vibration sensation in both feet from ankle down symmetrically..  Coordination: Rapid alternating movements normal in all extremities. Finger-to-nose and heel-to-shin performed accurately bilaterally.  Slightly unsteady while standing on either foot unsupported. Gait and Station: Arises from chair without difficulty. Stance is slightly bent to the right.    Antalgic gait and favors the back due to pain..  Unable to heel, toe and tandem walk without difficulty.  Reflexes: 2+ brisk and symmetric. Toes downgoing.      ASSESSMENT: 60 year old Caucasian lady with transient episode of slurred speech and gait ataxia in December 2021 likely of posterior circulation TIA from small vessel disease.  Unremarkable brain and  cervical spine imaging.  An EMG nerve conduction study.  She also has frequent falls which is likely multifactorial due to combination of underlying peripheral neuropathy from her autoimmune disease as well as arthritis and suspected degenerative spine disease.  She has significant back pain and gait difficulties due to lumbar spinal stenosis  PLAN: I had a long discussion with the patient regarding complaints of lightheadedness and dizziness which appears to be of unclear etiology but recommend further evaluation with checking CT angiogram of the brain and neck to rule out significant obstructive vascular stenosis.  We also discussed results of her lab work and EMG nerve conduction study I recommend she continue vitamin D replacement.  She is cleared significant disability from her back pain and spinal stenosis and has been unable to tolerate epidural injections or pain medication since recommend referral to neurosurgery to consider surgery.  Refer to Dr. Vertell Limber from [facility].  She will return for follow-up in the future in 3 months or call earlier if necessary. Greater than 50% time during this 35-minute visit  was spent in counseling and coordination of care about her TIA and frequent falls and answering questions Antony Contras, MD Note: This document was prepared with digital dictation and possible smart phrase technology. Any transcriptional errors that result from this process are unintentional.

## 2021-01-25 NOTE — Telephone Encounter (Signed)
Faxed referral to Dr. [facility] Urgent, requested Dr. Fredrich Birks first available. They will call patient to schedule. Phone: [phone]. Fax: [phone].

## 2021-01-26 ENCOUNTER — Telehealth: Payer: Self-pay | Admitting: Emergency Medicine

## 2021-01-26 ENCOUNTER — Telehealth: Payer: Self-pay | Admitting: Neurology

## 2021-01-26 NOTE — Telephone Encounter (Signed)
LVM for MRI results.

## 2021-01-26 NOTE — Telephone Encounter (Signed)
CT angio head w/wo & neck w/wo Evalyn Casco: [phone] (01/26/21-02/24/21)   Sent to GI

## 2021-01-31 ENCOUNTER — Telehealth: Payer: Self-pay | Admitting: Neurology

## 2021-01-31 NOTE — Telephone Encounter (Signed)
Pt states re: the CT scans Dr Pearlean Brownie wanted her to have, she states she had them at [facility] on 29th of December and they were fine.  Pt will not take again, please call to discuss.

## 2021-01-31 NOTE — Telephone Encounter (Signed)
Patient called in regarding tests that were ordered and she already had completed.  Discssed the results of the MRI spine/cervical wo contrast as well as the MRI head wo contrast.  Patient stated Dr. Pearlean Brownie had went over the results with her when she was in for her appointment too.  Patient denied further questions, verbalized understanding and expressed appreciation for the phone call.

## 2021-01-31 NOTE — Telephone Encounter (Signed)
Patient states when she saw Dr. Pearlean Brownie, he had ordered an angio of the head and neck, and she just had one done in December at [facility] by Dr. Phillips Odor.  She said she is filing for disability and wants to make sure you are aware so if they send you any questions or information you know she has had them done and Dr. Pearlean Brownie doesn't say she refused to have the testing done, she said she isn't made of money and no reason to do them twice.    Patient requested Dr. Pearlean Brownie to cancel the repeat tests she has already had.

## 2021-02-01 DIAGNOSIS — M4310 Spondylolisthesis, site unspecified: Secondary | ICD-10-CM | POA: Diagnosis not present

## 2021-02-06 DIAGNOSIS — M47817 Spondylosis without myelopathy or radiculopathy, lumbosacral region: Secondary | ICD-10-CM | POA: Diagnosis not present

## 2021-02-06 NOTE — Telephone Encounter (Signed)
Patient is not scheduled for CT.

## 2021-02-16 DIAGNOSIS — Z79899 Other long term (current) drug therapy: Secondary | ICD-10-CM | POA: Diagnosis not present

## 2021-02-16 DIAGNOSIS — M3500 Sicca syndrome, unspecified: Secondary | ICD-10-CM | POA: Diagnosis not present

## 2021-02-16 DIAGNOSIS — E559 Vitamin D deficiency, unspecified: Secondary | ICD-10-CM | POA: Diagnosis not present

## 2021-02-16 DIAGNOSIS — M797 Fibromyalgia: Secondary | ICD-10-CM | POA: Diagnosis not present

## 2021-02-16 DIAGNOSIS — M0609 Rheumatoid arthritis without rheumatoid factor, multiple sites: Secondary | ICD-10-CM | POA: Diagnosis not present

## 2021-02-20 DIAGNOSIS — M47817 Spondylosis without myelopathy or radiculopathy, lumbosacral region: Secondary | ICD-10-CM | POA: Diagnosis not present

## 2021-02-20 DIAGNOSIS — E871 Hypo-osmolality and hyponatremia: Secondary | ICD-10-CM | POA: Diagnosis not present

## 2021-03-03 ENCOUNTER — Telehealth: Payer: Self-pay | Admitting: Cardiovascular Disease

## 2021-03-03 NOTE — Telephone Encounter (Signed)
Pt c/o Shortness Of Breath: STAT if SOB developed within the last 24 hours or pt is noticeably SOB on the phone  1. Are you currently SOB (can you hear that pt is SOB on the phone)? no  2. How long have you been experiencing SOB? Over a month  3. Are you SOB when sitting or when up moving around? both  4. Are you currently experiencing any other symptoms? Lightheadedness, headache, nausea, feeling weakness and low blood pressure  Was able to make an appt for 7/27 at 815

## 2021-03-03 NOTE — Telephone Encounter (Signed)
Attempted to call patient, call went to voicemail. Left nonspecific voicemail for pt to return office call.

## 2021-03-08 ENCOUNTER — Ambulatory Visit: Payer: BC Managed Care – PPO | Admitting: Cardiovascular Disease

## 2021-03-08 NOTE — Telephone Encounter (Signed)
Attempted to call th pt but unable to LM... Pt had cancelled her appt this morning with Dr. Allyson Sabal.. will close this encounter and will reassess if the pt calls back.

## 2021-03-13 DIAGNOSIS — Z20822 Contact with and (suspected) exposure to covid-19: Secondary | ICD-10-CM | POA: Diagnosis not present

## 2021-03-16 ENCOUNTER — Other Ambulatory Visit: Payer: Self-pay

## 2021-03-16 ENCOUNTER — Ambulatory Visit (INDEPENDENT_AMBULATORY_CARE_PROVIDER_SITE_OTHER): Payer: BC Managed Care – PPO | Admitting: Pulmonary Disease

## 2021-03-16 DIAGNOSIS — R0600 Dyspnea, unspecified: Secondary | ICD-10-CM

## 2021-03-16 DIAGNOSIS — Z148 Genetic carrier of other disease: Secondary | ICD-10-CM

## 2021-03-16 DIAGNOSIS — R0609 Other forms of dyspnea: Secondary | ICD-10-CM

## 2021-03-16 LAB — PULMONARY FUNCTION TEST
DL/VA % pred: 92 %
DL/VA: 3.84 ml/min/mmHg/L
DLCO cor % pred: 96 %
DLCO cor: 21.02 ml/min/mmHg
DLCO unc % pred: 96 %
DLCO unc: 21.02 ml/min/mmHg
FEF 25-75 Post: 3.58 L/sec
FEF 25-75 Pre: 2.95 L/sec
FEF2575-%Change-Post: 21 %
FEF2575-%Pred-Post: 140 %
FEF2575-%Pred-Pre: 115 %
FEV1-%Change-Post: 5 %
FEV1-%Pred-Post: 109 %
FEV1-%Pred-Pre: 103 %
FEV1-Post: 3.08 L
FEV1-Pre: 2.91 L
FEV1FVC-%Change-Post: 2 %
FEV1FVC-%Pred-Pre: 102 %
FEV6-%Change-Post: 3 %
FEV6-%Pred-Post: 107 %
FEV6-%Pred-Pre: 103 %
FEV6-Post: 3.74 L
FEV6-Pre: 3.62 L
FEV6FVC-%Pred-Post: 103 %
FEV6FVC-%Pred-Pre: 103 %
FVC-%Change-Post: 3 %
FVC-%Pred-Post: 103 %
FVC-%Pred-Pre: 100 %
FVC-Post: 3.75 L
FVC-Pre: 3.62 L
Post FEV1/FVC ratio: 82 %
Post FEV6/FVC ratio: 100 %
Pre FEV1/FVC ratio: 80 %
Pre FEV6/FVC Ratio: 100 %
RV % pred: 88 %
RV: 1.81 L
TLC % pred: 103 %
TLC: 5.55 L

## 2021-03-16 NOTE — Patient Instructions (Signed)
Full PFT performed today. °

## 2021-03-16 NOTE — Progress Notes (Signed)
Full PFT performed today. °

## 2021-03-17 DIAGNOSIS — M2550 Pain in unspecified joint: Secondary | ICD-10-CM | POA: Diagnosis not present

## 2021-03-17 DIAGNOSIS — J988 Other specified respiratory disorders: Secondary | ICD-10-CM | POA: Diagnosis not present

## 2021-03-17 DIAGNOSIS — M549 Dorsalgia, unspecified: Secondary | ICD-10-CM | POA: Diagnosis not present

## 2021-03-17 DIAGNOSIS — M069 Rheumatoid arthritis, unspecified: Secondary | ICD-10-CM | POA: Diagnosis not present

## 2021-04-05 ENCOUNTER — Ambulatory Visit: Payer: BC Managed Care – PPO | Admitting: Pulmonary Disease

## 2021-04-16 ENCOUNTER — Ambulatory Visit (INDEPENDENT_AMBULATORY_CARE_PROVIDER_SITE_OTHER): Payer: BC Managed Care – PPO

## 2021-04-16 ENCOUNTER — Other Ambulatory Visit: Payer: Self-pay

## 2021-04-16 ENCOUNTER — Encounter: Payer: Self-pay | Admitting: Emergency Medicine

## 2021-04-16 ENCOUNTER — Ambulatory Visit
Admission: EM | Admit: 2021-04-16 | Discharge: 2021-04-16 | Disposition: A | Discharge status: Home / Self Care | Payer: BC Managed Care – PPO | Attending: Urgent Care | Admitting: Urgent Care

## 2021-04-16 DIAGNOSIS — R630 Anorexia: Secondary | ICD-10-CM

## 2021-04-16 DIAGNOSIS — R059 Cough, unspecified: Secondary | ICD-10-CM | POA: Diagnosis not present

## 2021-04-16 DIAGNOSIS — E8801 Alpha-1-antitrypsin deficiency: Secondary | ICD-10-CM | POA: Diagnosis not present

## 2021-04-16 DIAGNOSIS — R053 Chronic cough: Secondary | ICD-10-CM

## 2021-04-16 DIAGNOSIS — R140 Abdominal distension (gaseous): Secondary | ICD-10-CM

## 2021-04-16 MED ORDER — PROMETHAZINE-DM 6.25-15 MG/5ML PO SYRP: 5.0000 mL | ORAL_SOLUTION | Freq: Every evening | ORAL | 0 refills | Status: DC | PRN

## 2021-04-16 NOTE — ED Provider Notes (Signed)
[facility]   MRN: [id number] DOB: April 25, 1961  Subjective:   [name] is a 60 y.o. female presenting for 35-month history of persistent coughing worse at night.  Patient did a virtual visit with her PCP and was prescribed an antibiotic course that did not help.  She has also had abdominal bloating, decreased appetite past 2 months.  Feels like she is swollen around her neck area.  Has a history of dyspnea on exertion, alpha-1 antitrypsin deficiency, palpitations and an enlarged heart, rheumatoid arthritis.  No current facility-administered medications for this encounter.  Current Outpatient Medications:    Artificial Saliva (BIOTENE ORALBALANCE DRY MOUTH MT), Use as directed in the mouth or throat 4 (four) times daily., Disp: , Rfl:    aspirin EC 81 MG tablet, Take 1 tablet (81 mg total) by mouth daily. Swallow whole., Disp: 30 tablet, Rfl: 0   atenolol (TENORMIN) 50 MG tablet, Take 50 mg by mouth daily., Disp: , Rfl:    diclofenac (VOLTAREN) 75 MG EC tablet, Take 75 mg by mouth 2 (two) times daily., Disp: , Rfl:    DULoxetine (CYMBALTA) 30 MG capsule, Take 30 mg by mouth daily., Disp: , Rfl:    folic acid (FOLVITE) 1 MG tablet, Take 1 mg by mouth daily., Disp: , Rfl:    gabapentin (NEURONTIN) 300 MG capsule, Take 300 mg by mouth daily., Disp: , Rfl:    hydroxychloroquine (PLAQUENIL) 200 MG tablet, Take 400 mg by mouth daily. Pt states 400 mg once daily, Disp: , Rfl:    leflunomide (ARAVA) 10 MG tablet, Take 10 mg by mouth daily., Disp: , Rfl:    lisinopril (ZESTRIL) 10 MG tablet, Take 10 mg by mouth daily., Disp: , Rfl:    methocarbamol (ROBAXIN) 750 MG tablet, Take 750 mg by mouth in the morning and at bedtime., Disp: , Rfl:    Allergies  Allergen Reactions   Codeine Nausea And Vomiting   Penicillins Rash   Sulfa Drugs Cross Reactors Rash    Past Medical History:  Diagnosis Date   AAT (alpha-1-antitrypsin) deficiency (HCC)    Depression    Dyspnea on exertion     Enlarged heart    Hypertension    Palpitations    Stroke (HCC) 08/09/2020     Past Surgical History:  Procedure Laterality Date   APPENDECTOMY     BREAST ENHANCEMENT SURGERY     CESAREAN SECTION     CHOLECYSTECTOMY     COSMETIC SURGERY      Family History  Problem Relation Age of Onset   Heart disease Mother    Stroke Mother    Hypertension Mother    COPD Mother        alpha 1 antitrypsin deficiency   Alpha-1 antitrypsin deficiency Mother        MZ   Heart disease Father    Glaucoma Father    COPD Sister    COPD Brother    Healthy Brother    Healthy Sister    Breast cancer Maternal Aunt    Breast cancer Paternal Aunt    Breast cancer Cousin    Breast cancer Maternal Aunt    Breast cancer Cousin     Social History   Tobacco Use   Smoking status: Former    Packs/day: 0.25    Types: Cigarettes    Quit date: 11/19/2018    Years since quitting: 2.4   Smokeless tobacco: Never   Tobacco comments:    social smoking  Vaping  Use   Vaping Use: Never used  Substance Use Topics   Alcohol use: Yes    Comment: occasionally   Drug use: No    ROS   Objective:   Vitals: BP 119/78 (BP Location: Left Arm)   Pulse 67   Temp 98.1 F (36.7 C) (Oral)   Resp 18   SpO2 98%   Physical Exam Constitutional:      General: She is not in acute distress.    Appearance: Normal appearance. She is well-developed. She is not ill-appearing, toxic-appearing or diaphoretic.  HENT:     Head: Normocephalic and atraumatic.     Nose: Nose normal.     Mouth/Throat:     Mouth: Mucous membranes are moist.  Eyes:     Extraocular Movements: Extraocular movements intact.     Pupils: Pupils are equal, round, and reactive to light.  Cardiovascular:     Rate and Rhythm: Normal rate and regular rhythm.     Pulses: Normal pulses.     Heart sounds: Normal heart sounds. No murmur heard.   No friction rub. No gallop.  Pulmonary:     Effort: Pulmonary effort is normal. No respiratory  distress.     Breath sounds: Normal breath sounds. No stridor. No wheezing, rhonchi or rales.  Skin:    General: Skin is warm and dry.     Findings: No rash.  Neurological:     Mental Status: She is alert and oriented to person, place, and time.  Psychiatric:        Mood and Affect: Mood normal.        Behavior: Behavior normal.        Thought Content: Thought content normal.    DG Chest 2 View  Result Date: 04/16/2021 CLINICAL DATA:  Cough EXAM: CHEST - 2 VIEW COMPARISON:  01/20/2020 FINDINGS: Lungs are clear. Heart size and mediastinal contours are within normal limits. No effusion.  No pneumothorax. Visualized bones unremarkable. Peripherally calcified breast implants. Cholecystectomy clips. IMPRESSION: No acute cardiopulmonary disease. Electronically Signed   By: Corlis Leak M.D.   On: 04/16/2021 15:04     Assessment and Plan :   PDMP not reviewed this encounter.  1. Chronic cough   2. Abdominal bloating   3. Decreased appetite   4. Alpha-1-antitrypsin deficiency (HCC)     Suspect that her alpha-1 antitrypsin deficiency is the source of her symptoms.  Offered her an albuterol inhaler which she declined.  Use supportive care including cough syrup.  Follow-up with pulmonologist as soon as possible. Counseled patient on potential for adverse effects with medications prescribed/recommended today, ER and return-to-clinic precautions discussed, patient verbalized understanding.    Wallis Bamberg, [place] 04/16/21 [phone]

## 2021-04-16 NOTE — ED Triage Notes (Signed)
Pt here for dry cough at night x 3 months; pt sts did virtual with PCP and given antibiotics without relief; pt sts some abd bloating and decreased appetite x 2 months; pt sts some intermittent swelling around left collar bone area x months as well

## 2021-05-01 ENCOUNTER — Ambulatory Visit: Payer: BC Managed Care – PPO | Admitting: Neurology

## 2021-05-15 DIAGNOSIS — Z87891 Personal history of nicotine dependence: Secondary | ICD-10-CM | POA: Diagnosis not present

## 2021-05-15 DIAGNOSIS — I700 Atherosclerosis of aorta: Secondary | ICD-10-CM | POA: Diagnosis not present

## 2021-05-15 DIAGNOSIS — Z23 Encounter for immunization: Secondary | ICD-10-CM | POA: Diagnosis not present

## 2021-05-15 DIAGNOSIS — R059 Cough, unspecified: Secondary | ICD-10-CM | POA: Diagnosis not present

## 2021-05-15 DIAGNOSIS — R109 Unspecified abdominal pain: Secondary | ICD-10-CM | POA: Diagnosis not present

## 2021-05-24 DIAGNOSIS — M3500 Sicca syndrome, unspecified: Secondary | ICD-10-CM | POA: Diagnosis not present

## 2021-05-25 DIAGNOSIS — M3500 Sicca syndrome, unspecified: Secondary | ICD-10-CM | POA: Diagnosis not present

## 2021-05-25 DIAGNOSIS — Z79899 Other long term (current) drug therapy: Secondary | ICD-10-CM | POA: Diagnosis not present

## 2021-05-25 DIAGNOSIS — M0609 Rheumatoid arthritis without rheumatoid factor, multiple sites: Secondary | ICD-10-CM | POA: Diagnosis not present

## 2021-05-25 DIAGNOSIS — M797 Fibromyalgia: Secondary | ICD-10-CM | POA: Diagnosis not present

## 2021-05-31 ENCOUNTER — Other Ambulatory Visit: Payer: Self-pay | Admitting: *Deleted

## 2021-05-31 DIAGNOSIS — Z87891 Personal history of nicotine dependence: Secondary | ICD-10-CM

## 2021-06-14 ENCOUNTER — Ambulatory Visit: Payer: BC Managed Care – PPO | Admitting: Cardiovascular Disease

## 2021-06-21 ENCOUNTER — Other Ambulatory Visit: Payer: Self-pay

## 2021-06-21 ENCOUNTER — Ambulatory Visit
Admission: RE | Admit: 2021-06-21 | Discharge: 2021-06-21 | Disposition: A | Discharge status: Home / Self Care | Payer: BC Managed Care – PPO | Source: Ambulatory Visit | Attending: Acute Care | Admitting: Acute Care

## 2021-06-21 ENCOUNTER — Telehealth (INDEPENDENT_AMBULATORY_CARE_PROVIDER_SITE_OTHER): Payer: BC Managed Care – PPO | Admitting: Acute Care

## 2021-06-21 ENCOUNTER — Encounter: Payer: Self-pay | Admitting: Acute Care

## 2021-06-21 DIAGNOSIS — Z87891 Personal history of nicotine dependence: Secondary | ICD-10-CM

## 2021-06-21 NOTE — Patient Instructions (Signed)
Thank you for participating in the [facility]. It was our pleasure to meet you today. We will call you with the results of your scan within the next few days. Your scan will be assigned a Lung RADS category score by the physicians reading the scans.  This Lung RADS score determines follow up scanning.  See below for description of categories, and follow up screening recommendations. We will be in touch to schedule your follow up screening annually or based on recommendations of our providers. We will fax a copy of your scan results to your Primary Care Physician, or the physician who referred you to the program, to ensure they have the results. Please call the office if you have any questions or concerns regarding your scanning experience or results.  Our office number is [phone]. Please speak with Denise Phelps, RN. She is our Lung Cancer Screening RN. If she is unavailable when you call, please have the office staff send her a message. She will return your call at her earliest convenience. Remember, if your scan is normal, we will scan you annually as long as you continue to meet the criteria for the program. (Age 55-77, Current smoker or smoker who has quit within the last 15 years). If you are a smoker, remember, quitting is the single most powerful action that you can take to decrease your risk of lung cancer and other pulmonary, breathing related problems. We know quitting is hard, and we are here to help.  Please let us know if there is anything we can do to help you meet your goal of quitting. If you are a former smoker, congratulations. We are proud of you! Remain smoke free! Remember you can refer friends or family members through the number above.  We will screen them to make sure they meet criteria for the program. Thank you for helping us take better care of you by participating in Lung Screening.  Lung RADS Categories:  Lung RADS 1: no nodules  or definitely non-concerning nodules.  Recommendation is for a repeat annual scan in 12 months.  Lung RADS 2:  nodules that are non-concerning in appearance and behavior with a very low likelihood of becoming an active cancer. Recommendation is for a repeat annual scan in 12 months.  Lung RADS 3: nodules that are probably non-concerning , includes nodules with a low likelihood of becoming an active cancer.  Recommendation is for a 6-month repeat screening scan. Often noted after an upper respiratory illness. We will be in touch to make sure you have no questions, and to schedule your 6-month scan.  Lung RADS 4 A: nodules with concerning findings, recommendation is most often for a follow up scan in 3 months or additional testing based on our provider's assessment of the scan. We will be in touch to make sure you have no questions and to schedule the recommended 3 month follow up scan.  Lung RADS 4 B:  indicates findings that are concerning. We will be in touch with you to schedule additional diagnostic testing based on our provider's  assessment of the scan.   

## 2021-06-21 NOTE — Progress Notes (Signed)
Virtual Visit via Video Note  I connected with [name] on 06/21/21 at 11:30 AM EST by a video enabled telemedicine application and verified that I am speaking with the correct person using two identifiers.  Location: Patient: At home Provider:  [address], [place], [place], [address]    I discussed the limitations of evaluation and management by telemedicine and the availability of in person appointments. The patient expressed understanding and agreed to proceed.   Shared Decision Making Visit Lung Cancer Screening Program [phone])   Eligibility: Age 60 y.o. Pack Years Smoking History Calculation 25 pack year smoking histoey (# packs/per year x # years smoked) Recent History of coughing up blood  no Unexplained weight loss? no ( >Than 15 pounds within the last 6 months ) Prior History Lung / other cancer no (Diagnosis within the last 5 years already requiring surveillance chest CT Scans). Smoking Status Former Smoker Former Smokers: Years since quit: 2 years  Quit Date: 2020  Visit Components: Discussion included one or more decision making aids. yes Discussion included risk/benefits of screening. yes Discussion included potential follow up diagnostic testing for abnormal scans. yes Discussion included meaning and risk of over diagnosis. yes Discussion included meaning and risk of False Positives. yes Discussion included meaning of total radiation exposure. yes  Counseling Included: Importance of adherence to annual lung cancer LDCT screening. yes Impact of comorbidities on ability to participate in the program. yes Ability and willingness to under diagnostic treatment. yes  Smoking Cessation Counseling: Current Smokers:  Discussed importance of smoking cessation. yes Information about tobacco cessation classes and interventions provided to patient. yes Patient provided with "ticket" for LDCT Scan. yes Symptomatic Patient. no  Counseling NA Diagnosis  Code: Tobacco Use Z72.0 Asymptomatic Patient yes  Counseling (Intermediate counseling: > three minutes counseling) R4431 Former Smokers:  Discussed the importance of maintaining cigarette abstinence. yes Diagnosis Code: Personal History of Nicotine Dependence. V40.086 Information about tobacco cessation classes and interventions provided to patient. Yes Patient provided with "ticket" for LDCT Scan. yes Written Order for Lung Cancer Screening with LDCT placed in Epic. Yes (CT Chest Lung Cancer Screening Low Dose W/O CM) [id number] Z12.2-Screening of respiratory organs Z87.891-Personal history of nicotine dependence  I spent 25 minutes of face to face time with [name] discussing the risks and benefits of lung cancer screening. We viewed a power point together that explained in detail the above noted topics. We took the time to pause the power point at intervals to allow for questions to be asked and answered to ensure understanding. We discussed that she had taken the single most powerful action possible to decrease her risk of developing lung cancer when she quit smoking. I counseled her to remain smoke free, and to contact me if she ever had the desire to smoke again so that I can provide resources and tools to help support the effort to remain smoke free. We discussed the time and location of the scan, and that either  Abigail Miyamoto RN or I will call with the results within  24-48 hours of receiving them. She has my card and contact information in the event she needs to speak with me, in addition to a copy of the power point we reviewed as a resource. She verbalized understanding of all of the above and had no further questions upon leaving the office.     I explained to the patient that there has been a high incidence of coronary artery disease noted on  these exams. I explained that this is a non-gated exam therefore degree or severity cannot be determined. This patient is not on statin therapy.  I have asked the patient to follow-up with their PCP regarding any incidental finding of coronary artery disease and management with diet or medication as they feel is clinically indicated. The patient verbalized understanding of the above and had no further questions.     Bevelyn Ngo, NP 06/21/2021

## 2021-07-03 ENCOUNTER — Other Ambulatory Visit: Payer: Self-pay | Admitting: Acute Care

## 2021-07-03 DIAGNOSIS — Z87891 Personal history of nicotine dependence: Secondary | ICD-10-CM

## 2021-07-14 ENCOUNTER — Ambulatory Visit
Admission: EM | Admit: 2021-07-14 | Discharge: 2021-07-14 | Disposition: A | Discharge status: Home / Self Care | Payer: BC Managed Care – PPO | Attending: Internal Medicine | Admitting: Internal Medicine

## 2021-07-14 ENCOUNTER — Other Ambulatory Visit: Payer: Self-pay

## 2021-07-14 DIAGNOSIS — J029 Acute pharyngitis, unspecified: Secondary | ICD-10-CM | POA: Diagnosis not present

## 2021-07-14 DIAGNOSIS — J069 Acute upper respiratory infection, unspecified: Secondary | ICD-10-CM

## 2021-07-14 LAB — POCT RAPID STREP A (OFFICE): Rapid Strep A Screen: NEGATIVE

## 2021-07-14 MED ORDER — FLUTICASONE PROPIONATE 50 MCG/ACT NA SUSP: 1.0000 | Freq: Every day | NASAL | 0 refills | Status: DC

## 2021-07-14 MED ORDER — BENZONATATE 100 MG PO CAPS: 100.0000 mg | ORAL_CAPSULE | Freq: Three times a day (TID) | ORAL | 0 refills | Status: DC | PRN

## 2021-07-14 MED ORDER — CETIRIZINE HCL 10 MG PO TABS: 10.0000 mg | ORAL_TABLET | Freq: Every day | ORAL | 0 refills | Status: DC

## 2021-07-14 NOTE — ED Provider Notes (Signed)
[name]    CSN: [id number] Arrival date & time: 07/14/21  1244      History   Chief Complaint Chief Complaint  Patient presents with   Cough    HPI [name] is a 60 y.o. female.   Patient presents with nasal congestion, cough, sore throat, bilateral ear discomfort that started approximately 4 days ago.  Patient denies any known fevers.  Patient has had cough for several months and is closely followed by pulmonology for alpha 1 antitrypsin deficiency.  Recently had low-dose CT scan that showed emphysema.  Patient reports the cough is worsened since start of upper respiratory symptoms.  Patient denies any known sick contacts.  Denies chest pain, shortness of breath, nausea, vomiting, diarrhea, abdominal pain.  Patient has not yet taken any medications to help alleviate symptoms.   Cough  Past Medical History:  Diagnosis Date   AAT (alpha-1-antitrypsin) deficiency (HCC)    Depression    Dyspnea on exertion    Enlarged heart    Hypertension    Palpitations    Stroke (HCC) 08/09/2020    Patient Active Problem List   Diagnosis Date Noted   Back pain 01/25/2021   Bilateral stenosis of lateral recess of lumbar spine 01/25/2021   Alpha-1-antitrypsin deficiency carrier 09/28/2020   Rheumatoid arthritis (HCC) 09/28/2020   Essential hypertension 09/29/2015   Dyspnea on exertion 09/29/2015   Palpitations 09/29/2015    Past Surgical History:  Procedure Laterality Date   APPENDECTOMY     BREAST ENHANCEMENT SURGERY     CESAREAN SECTION     CHOLECYSTECTOMY     COSMETIC SURGERY      OB History   No obstetric history on file.      Home Medications    Prior to Admission medications   Medication Sig Start Date End Date Taking? Authorizing Provider  benzonatate (TESSALON) 100 MG capsule Take 1 capsule (100 mg total) by mouth every 8 (eight) hours as needed for cough. 07/14/21  Yes Silveria Botz, Acie Fredrickson, FNP  cetirizine (ZYRTEC) 10 MG tablet Take 1 tablet  (10 mg total) by mouth daily for 10 days. 07/14/21 07/24/21 Yes Winifred Balogh, Acie Fredrickson, FNP  fluticasone (FLONASE) 50 MCG/ACT nasal spray Place 1 spray into both nostrils daily for 3 days. 07/14/21 07/17/21 Yes Jolan Mealor, Acie Fredrickson, FNP  Artificial Saliva (BIOTENE ORALBALANCE DRY MOUTH MT) Use as directed in the mouth or throat 4 (four) times daily.    [provider]  aspirin EC 81 MG tablet Take 1 tablet (81 mg total) by mouth daily. Swallow whole. 08/10/20   Pricilla Loveless, MD  atenolol (TENORMIN) 50 MG tablet Take 50 mg by mouth daily.    [provider]  diclofenac (VOLTAREN) 75 MG EC tablet Take 75 mg by mouth 2 (two) times daily.    [provider]  DULoxetine (CYMBALTA) 30 MG capsule Take 30 mg by mouth daily.    [provider]  folic acid (FOLVITE) 1 MG tablet Take 1 mg by mouth daily. 07/27/20   [provider]  gabapentin (NEURONTIN) 300 MG capsule Take 300 mg by mouth daily. 07/21/20   [provider]  hydroxychloroquine (PLAQUENIL) 200 MG tablet Take 400 mg by mouth daily. Pt states 400 mg once daily 07/27/20   [provider]  leflunomide (ARAVA) 10 MG tablet Take 10 mg by mouth daily. 08/09/20   [provider]  lisinopril (ZESTRIL) 10 MG tablet Take 10 mg by mouth daily.    [provider]  methocarbamol (ROBAXIN) 750 MG tablet Take 750 mg by mouth in the morning and at bedtime.    [provider]  promethazine-dextromethorphan (PROMETHAZINE-DM) 6.25-15 MG/5ML syrup Take 5 mLs by mouth at bedtime as needed for cough. 04/16/21   Wallis Bamberg, PA-C    Family History Family History  Problem Relation Age of Onset   Heart disease Mother    Stroke Mother    Hypertension Mother    COPD Mother        alpha 1 antitrypsin deficiency   Alpha-1 antitrypsin deficiency Mother        MZ   Heart disease Father    Glaucoma Father    COPD Sister    COPD Brother    Healthy Brother    Healthy Sister    Breast cancer  Maternal Aunt    Breast cancer Paternal Aunt    Breast cancer Cousin    Breast cancer Maternal Aunt    Breast cancer Cousin     Social History Social History   Tobacco Use   Smoking status: Former    Packs/day: 0.25    Types: Cigarettes    Quit date: 11/19/2018    Years since quitting: 2.6   Smokeless tobacco: Never   Tobacco comments:    social smoking  Vaping Use   Vaping Use: Never used  Substance Use Topics   Alcohol use: Yes    Comment: occasionally   Drug use: No     Allergies   Codeine, Penicillins, and Sulfa drugs cross reactors   Review of Systems Review of Systems Per HPI  Physical Exam Triage Vital Signs ED Triage Vitals  Enc Vitals Group     BP 07/14/21 1428 112/76     Pulse Rate 07/14/21 1428 85     Resp 07/14/21 1428 18     Temp 07/14/21 1428 (!) 97.5 F (36.4 C)     Temp Source 07/14/21 1428 Oral     SpO2 07/14/21 1428 97 %     Weight --      Height --      Head Circumference --      Peak Flow --      Pain Score 07/14/21 1429 0     Pain Loc --      Pain Edu? --      Excl. in GC? --    No data found.  Updated Vital Signs BP 112/76 (BP Location: Right Arm)   Pulse 85   Temp (!) 97.5 F (36.4 C) (Oral)   Resp 18   SpO2 97%   Visual Acuity Right Eye Distance:   Left Eye Distance:   Bilateral Distance:    Right Eye Near:   Left Eye Near:    Bilateral Near:     Physical Exam Constitutional:      General: She is not in acute distress.    Appearance: Normal appearance. She is not toxic-appearing or diaphoretic.  HENT:     Head: Normocephalic and atraumatic.     Right Ear: Ear canal normal. No drainage, swelling or tenderness. A middle ear effusion is present. No mastoid tenderness. Tympanic membrane is not perforated, erythematous or bulging.     Left Ear: Ear canal normal. No drainage, swelling or tenderness. A middle ear effusion is present. No mastoid tenderness. Tympanic membrane is not perforated, erythematous or bulging.      Nose: Congestion present.     Mouth/Throat:     Mouth: Mucous membranes are moist.  Pharynx: Posterior oropharyngeal erythema present.  Eyes:     Extraocular Movements: Extraocular movements intact.     Conjunctiva/sclera: Conjunctivae normal.     Pupils: Pupils are equal, round, and reactive to light.  Cardiovascular:     Rate and Rhythm: Normal rate and regular rhythm.     Pulses: Normal pulses.     Heart sounds: Normal heart sounds.  Pulmonary:     Effort: Pulmonary effort is normal. No respiratory distress.     Breath sounds: Normal breath sounds. No stridor. No wheezing, rhonchi or rales.  Abdominal:     General: Abdomen is flat. Bowel sounds are normal.     Palpations: Abdomen is soft.  Musculoskeletal:        General: Normal range of motion.     Cervical back: Normal range of motion.  Skin:    General: Skin is warm and dry.  Neurological:     General: No focal deficit present.     Mental Status: She is alert and oriented to person, place, and time. Mental status is at baseline.  Psychiatric:        Mood and Affect: Mood normal.        Behavior: Behavior normal.     UC Treatments / Results  Labs (all labs ordered are listed, but only abnormal results are displayed) Labs Reviewed  CULTURE, GROUP A STREP ([facility])  COVID-19, FLU A+B NAA  POCT RAPID STREP A (OFFICE)    EKG   Radiology No results found.  Procedures Procedures (including critical care time)  Medications Ordered in UC Medications - No data to display  Initial Impression / Assessment and Plan / UC Course  I have reviewed the triage vital signs and the nursing notes.  Pertinent labs & imaging results that were available during my care of the patient were reviewed by me and considered in my medical decision making (see chart for details).     Patient presents with symptoms likely from a viral upper respiratory infection. Differential includes bacterial pneumonia, sinusitis, allergic rhinitis,  COVID-19. Do not suspect underlying cardiopulmonary process. Symptoms seem unlikely related to ACS, CHF or COPD exacerbations, pneumonia, pneumothorax. Patient is nontoxic appearing and not in need of emergent medical intervention.  COVID-19 and flu PCR pending.  Do not think that chest imaging is necessary given that patient does not have any respiratory distress and no adventitious lung sounds are seen on exam.  Recommended symptom control with over the counter medications that are safe with high blood pressure.  Suggested Coricidin HBP.  Will prescribe cetirizine and Flonase to help alleviate bilateral middle ear effusion.  Benzonatate to take as needed for cough.  Return if symptoms fail to improve in 1-2 weeks or you develop shortness of breath, chest pain, severe headache. Patient states understanding and is agreeable.  Discharged with PCP followup.  Final Clinical Impressions(s) / UC Diagnoses   Final diagnoses:  Viral upper respiratory tract infection with cough  Sore throat     Discharge Instructions      It appears that you have a viral upper respiratory infection that should resolve in the next few days with symptomatic treatment.  Your rapid strep test was negative.  Throat culture, COVID-19, flu swabs are pending.  We will call if they are positive.  You have been prescribed 3 medications to help alleviate your symptoms.     ED Prescriptions     Medication Sig Dispense Auth. Provider   cetirizine (ZYRTEC) 10 MG tablet Take  1 tablet (10 mg total) by mouth daily for 10 days. 30 tablet [place], [place] E, [place]   fluticasone [facility]) 50 MCG/ACT nasal spray Place 1 spray into both nostrils daily for 3 days. 16 g Heavin Sebree, Rolly Salter E, [place]   benzonatate (TESSALON) 100 MG capsule Take 1 capsule (100 mg total) by mouth every 8 (eight) hours as needed for cough. 21 capsule [place], Acie Fredrickson, [place]      PDMP not reviewed this encounter.   Gustavus Bryant, [place] 07/14/21 [phone]

## 2021-07-14 NOTE — ED Triage Notes (Signed)
Pt c/o cough, sore throat, sinus congestion, ear ache, malaise.   Denies headache, nausea, vomiting, diarrhea, constipation.   Onset cough several months ago but other symptoms started Tuesday.

## 2021-07-14 NOTE — Discharge Instructions (Signed)
It appears that you have a viral upper respiratory infection that should resolve in the next few days with symptomatic treatment.  Your rapid strep test was negative.  Throat culture, COVID-19, flu swabs are pending.  We will call if they are positive.  You have been prescribed 3 medications to help alleviate your symptoms.

## 2021-07-15 LAB — COVID-19, FLU A+B NAA
Influenza A, NAA: NOT DETECTED
Influenza B, NAA: NOT DETECTED
SARS-CoV-2, NAA: NOT DETECTED

## 2021-07-17 LAB — CULTURE, GROUP A STREP (THRC)

## 2021-07-17 NOTE — Progress Notes (Deleted)
Cardiology Clinic Note   Patient Name: [name] Date of Encounter: 07/17/2021  Primary Care Provider:  London Pepper, MD Primary Cardiologist:  Quay Burow, MD  Patient Profile    [name] presents to the clinic today for an evaluation of her shortness of breath.  Past Medical History    Past Medical History:  Diagnosis Date   AAT (alpha-1-antitrypsin) deficiency ([place])    Depression    Dyspnea on exertion    Enlarged heart    Hypertension    Palpitations    Stroke ([place]) 08/09/2020   Past Surgical History:  Procedure Laterality Date   APPENDECTOMY     BREAST ENHANCEMENT SURGERY     CESAREAN SECTION     CHOLECYSTECTOMY     COSMETIC SURGERY      Allergies  Allergies  Allergen Reactions   Codeine Nausea And Vomiting   Penicillins Rash   Sulfa Drugs Cross Reactors Rash    History of Present Illness    [name] has a PMH of chronic dyspnea on exertion, echocardiogram 3/21 showed normal findings.  Coronary CTA 4/21 showed no coronary disease with calcium score of 0.  It was not felt that her dyspnea was related to cardiac issues.  She also follows with pulmonology, Dr. Carlis Abbott at [facility] pulmonology by Dr. Wynonia Musty.  She has alpha-1 antitrypsin deficiency, MZ genotype.  Her MZ genotype is not associated with an increased risk of lung disease in non-smokers.  High risk is seen in MZ individuals who smoke however, she is a former smoker.  She was seen by Bosie Helper, PA-C on 10/12/2020.  During that time she continued to have dyspnea on exertion.  She was seen and evaluated in the emergency department 12/21 with transient slurred speech.  Head CT was unremarkable.  An MRI was ordered but patient could not tolerate.  She follows with Dr. Leonie Man.  She was placed on aspirin 81 mg.  She denied history of palpitations and tachycardia.  She was noted to have some hypertension.  Lisinopril was added to her medication regimen by her PCP.  She reported she had  persistent cough and hoarseness for the last 2 months.  She was instructed to follow-up with her PCP for medication adjustment.  She was admitted to the hospital 07/14/21.  She reported nasal congestion, cough, sore throat, and bilateral ear discomfort for approximately 4 days.  She denied fevers.  She did note cough for several months which is followed by pulmonology.  A CT scan showed emphysema.  She reported her cough has been worse since she has had her upper respiratory symptoms.  She denied known sick contacts.  She denied chest pain, shortness of breath, nausea, vomiting, abdominal pain.  Her rapid strep is negative, COVID-negative, symptom control was recommended.  She was prescribed Flonase and cetirizine to help alleviate her bilateral middle ear effusion.  She presents to the clinic today for follow-up evaluation and states***  *** denies chest pain, shortness of breath, lower extremity edema, fatigue, palpitations, melena, hematuria, hemoptysis, diaphoresis, weakness, presyncope, syncope, orthopnea, and PND.     Home Medications    Prior to Admission medications   Medication Sig Start Date End Date Taking? Authorizing Provider  Artificial Saliva (BIOTENE ORALBALANCE DRY MOUTH MT) Use as directed in the mouth or throat 4 (four) times daily.    [provider]  aspirin EC 81 MG tablet Take 1 tablet (81 mg total) by mouth daily. Swallow whole. 08/10/20   Sherwood Gambler, MD  atenolol (TENORMIN) 50 MG tablet Take 50 mg by mouth daily.    [provider]  benzonatate (TESSALON) 100 MG capsule Take 1 capsule (100 mg total) by mouth every 8 (eight) hours as needed for cough. 07/14/21   Gustavus Bryant, FNP  cetirizine (ZYRTEC) 10 MG tablet Take 1 tablet (10 mg total) by mouth daily for 10 days. 07/14/21 07/24/21  Gustavus Bryant, FNP  diclofenac (VOLTAREN) 75 MG EC tablet Take 75 mg by mouth 2 (two) times daily.    [provider]  DULoxetine (CYMBALTA) 30 MG capsule  Take 30 mg by mouth daily.    [provider]  fluticasone (FLONASE) 50 MCG/ACT nasal spray Place 1 spray into both nostrils daily for 3 days. 07/14/21 07/17/21  Gustavus Bryant, FNP  folic acid (FOLVITE) 1 MG tablet Take 1 mg by mouth daily. 07/27/20   [provider]  gabapentin (NEURONTIN) 300 MG capsule Take 300 mg by mouth daily. 07/21/20   [provider]  hydroxychloroquine (PLAQUENIL) 200 MG tablet Take 400 mg by mouth daily. Pt states 400 mg once daily 07/27/20   [provider]  leflunomide (ARAVA) 10 MG tablet Take 10 mg by mouth daily. 08/09/20   [provider]  lisinopril (ZESTRIL) 10 MG tablet Take 10 mg by mouth daily.    [provider]  methocarbamol (ROBAXIN) 750 MG tablet Take 750 mg by mouth in the morning and at bedtime.    [provider]  promethazine-dextromethorphan (PROMETHAZINE-DM) 6.25-15 MG/5ML syrup Take 5 mLs by mouth at bedtime as needed for cough. 04/16/21   Wallis Bamberg, PA-C    Family History    Family History  Problem Relation Age of Onset   Heart disease Mother    Stroke Mother    Hypertension Mother    COPD Mother        alpha 1 antitrypsin deficiency   Alpha-1 antitrypsin deficiency Mother        MZ   Heart disease Father    Glaucoma Father    COPD Sister    COPD Brother    Healthy Brother    Healthy Sister    Breast cancer Maternal Aunt    Breast cancer Paternal Aunt    Breast cancer Cousin    Breast cancer Maternal Aunt    Breast cancer Cousin    She indicated that her mother is deceased. She indicated that her father is alive. She indicated that both of her sisters are alive. She indicated that both of her brothers are alive. She indicated that her maternal grandmother is deceased. She indicated that her maternal grandfather is deceased. She indicated that her paternal grandmother is deceased. She indicated that her paternal grandfather is deceased. She indicated that the status of her  paternal aunt is unknown.  Social History    Social History   Socioeconomic History   Marital status: Divorced    Spouse name: Not on file   Number of children: Not on file   Years of education: Not on file   Highest education level: Not on file  Occupational History    Comment: full time 10/17/20  Tobacco Use   Smoking status: Former    Packs/day: 0.25    Types: Cigarettes    Quit date: 11/19/2018    Years since quitting: 2.6   Smokeless tobacco: Never   Tobacco comments:    social smoking  Vaping Use   Vaping Use: Never used  Substance and Sexual Activity  Alcohol use: Yes    Comment: occasionally   Drug use: No   Sexual activity: Not on file  Other Topics Concern   Not on file  Social History Narrative   Lives alone   Right Handed   Drinks no caffeine   Social Determinants of Health   Financial Resource Strain: Not on file  Food Insecurity: Not on file  Transportation Needs: Not on file  Physical Activity: Not on file  Stress: Not on file  Social Connections: Not on file  Intimate Partner Violence: Not on file     Review of Systems    General:  No chills, fever, night sweats or weight changes.  Cardiovascular:  No chest pain, dyspnea on exertion, edema, orthopnea, palpitations, paroxysmal nocturnal dyspnea. Dermatological: No rash, lesions/masses Respiratory: No cough, dyspnea Urologic: No hematuria, dysuria Abdominal:   No nausea, vomiting, diarrhea, bright red blood per rectum, melena, or hematemesis Neurologic:  No visual changes, wkns, changes in mental status. All other systems reviewed and are otherwise negative except as noted above.  Physical Exam    VS:  There were no vitals taken for this visit. , BMI There is no height or weight on file to calculate BMI. GEN: Well nourished, well developed, in no acute distress. HEENT: normal. Neck: Supple, no JVD, carotid bruits, or masses. Cardiac: RRR, no murmurs, rubs, or gallops. No clubbing, cyanosis,  edema.  Radials/DP/PT 2+ and equal bilaterally.  Respiratory:  Respirations regular and unlabored, clear to auscultation bilaterally. GI: Soft, nontender, nondistended, BS + x 4. MS: no deformity or atrophy. Skin: warm and dry, no rash. Neuro:  Strength and sensation are intact. Psych: Normal affect.  Accessory Clinical Findings    Recent Labs: 08/09/2020: BUN 5; Creatinine, Ser 0.71; Hemoglobin 12.4; Platelets 198; Potassium 3.4; Sodium 138 09/28/2020: ALT 51 10/17/2020: TSH 2.400   Recent Lipid Panel    Component Value Date/Time   CHOL 221 (H) 10/17/2020 0905   TRIG 67 10/17/2020 0905   HDL 62 10/17/2020 0905   CHOLHDL 3.6 10/17/2020 0905   LDLCALC 147 (H) 10/17/2020 0905    ECG personally reviewed by me today- *** - No acute changes  Echocardiogram 10/20/2019 IMPRESSIONS     1. Left ventricular ejection fraction, by estimation, is 60 to 65%. The  left ventricle has normal function. The left ventricle has no regional  wall motion abnormalities. There is mild left ventricular hypertrophy.  Left ventricular diastolic parameters  were normal. The average left ventricular global longitudinal strain is  -20.2 %.   2. Right ventricular systolic function is normal. The right ventricular  size is normal. There is normal pulmonary artery systolic pressure. The  estimated right ventricular systolic pressure is [id number] mmHg.   3. The mitral valve is normal in structure. Trivial mitral valve  regurgitation.   4. The aortic valve is tricuspid. Aortic valve regurgitation is trivial.  No aortic stenosis is present.   5. The inferior vena cava is normal in size with greater than 50%  respiratory variability, suggesting right atrial pressure of 3 mmHg.   Comparison(s): 09/20/15 EF 55-60%.  Assessment & Plan   1.  Essential hypertension-BP today***.  Well-controlled at home. Continue lisinopril, atenolol Heart healthy low-sodium diet-salty 6 given Increase physical activity as  tolerated  Dyspnea on exertion-chronic.  Presented to the emergency department on July 14, 2021 with complaints of bilateral ear pain increased congestion, and cough.  Her flu and COVID were negative.  Symptom management was recommended. Follows with pulmonology  Alpha 1 antitrypsin deficiency carrier-seen and evaluated at [facility].  Patient not felt to need augmentation therapy.  She does receive annual PFTs, liver function test, liver FibroScan every 2 years.  Evern Core was seen in the emergency department 12/21.  She follows with neurology.  Neurologically intact.  No further episodes. Continue aspirin  Disposition: Follow-up with Dr. Gwenlyn Found in 6 months.  Jossie Ng. Katheleen Stella NP-C    07/17/2021, 6:27 AM [place] [address] Office [phone] Fax [phone]  Notice: This dictation was prepared with Dragon dictation along with smaller phrase technology. Any transcriptional errors that result from this process are unintentional and may not be corrected upon review.  I spent***minutes examining this patient, reviewing medications, and using patient centered shared decision making involving her cardiac care.  Prior to her visit I spent greater than 20 minutes reviewing her past medical history,  medications, and prior cardiac tests.

## 2021-07-18 ENCOUNTER — Ambulatory Visit: Payer: BC Managed Care – PPO | Admitting: General Practice

## 2021-08-12 DIAGNOSIS — M25571 Pain in right ankle and joints of right foot: Secondary | ICD-10-CM | POA: Diagnosis not present

## 2021-08-12 DIAGNOSIS — M79671 Pain in right foot: Secondary | ICD-10-CM | POA: Diagnosis not present

## 2021-09-22 ENCOUNTER — Telehealth: Payer: Self-pay | Admitting: Acute Care

## 2021-09-22 NOTE — Telephone Encounter (Signed)
Called and spoke with patient. She was requesting to have her most recent lung cancer screening CT report to be faxed to her PCP Dr. Orland Mustard and her rheumatology Dr. Kathlene November. I advised her that I would go ahead and fax copies to their office. She verbalized understanding.   Nothing further needed at time of call.

## 2021-09-25 DIAGNOSIS — Z79899 Other long term (current) drug therapy: Secondary | ICD-10-CM | POA: Diagnosis not present

## 2021-09-25 DIAGNOSIS — M797 Fibromyalgia: Secondary | ICD-10-CM | POA: Diagnosis not present

## 2021-09-25 DIAGNOSIS — M0609 Rheumatoid arthritis without rheumatoid factor, multiple sites: Secondary | ICD-10-CM | POA: Diagnosis not present

## 2021-09-25 DIAGNOSIS — M3500 Sicca syndrome, unspecified: Secondary | ICD-10-CM | POA: Diagnosis not present

## 2021-10-02 DIAGNOSIS — Z0279 Encounter for issue of other medical certificate: Secondary | ICD-10-CM

## 2021-10-11 DIAGNOSIS — Z5181 Encounter for therapeutic drug level monitoring: Secondary | ICD-10-CM | POA: Diagnosis not present

## 2021-10-11 DIAGNOSIS — I700 Atherosclerosis of aorta: Secondary | ICD-10-CM | POA: Diagnosis not present

## 2021-10-11 DIAGNOSIS — E785 Hyperlipidemia, unspecified: Secondary | ICD-10-CM | POA: Diagnosis not present

## 2021-10-11 DIAGNOSIS — I10 Essential (primary) hypertension: Secondary | ICD-10-CM | POA: Diagnosis not present

## 2021-10-11 DIAGNOSIS — R059 Cough, unspecified: Secondary | ICD-10-CM | POA: Diagnosis not present

## 2021-11-14 NOTE — Progress Notes (Deleted)
?Cardiology Office Note:   ? ?Date:  11/14/2021  ? ?ID:  [name], DOB 05-30-1961, MRN [id number] ? ?PCP:  Farris Has, MD ?  ?[facility] Providers ?Cardiologist:  Nanetta Batty, MD { ?Click to update primary MD,subspecialty MD or APP then REFRESH:1}   ? ?Referring MD: Farris Has, MD  ? ?No chief complaint on file. ?*** ? ?History of Present Illness:   ? ?[name] is a 61 y.o. female with a hx of HTN, RA, sjogren's syndrome, frequent falls, alpha 1 antitrypsin deficiency, stroke, depression, and chronic dyspnea on exertion. Echo march 2021 was essentially normal. Coronary CTA April 2021 with calcium score zero and no CAD. DOE felt unrelated to her heart and she was referred back to pulmonology. She has MZ genotype alpha 1 antitrypsin deficiency which increases risk of lung disease in smokers - she is a former smoker.   She was last seen by Corine Shelter 10/2020 who referred her back to PCP to change ACEI to ARB for dry cough. She had a possible TIA - ER visit 07/2020. She followed with neurology and is on ASA. She has frequent dizziness, lightheadedness, and falls. She has spinal stenosis and has not tolerated injections or pain medications. She was referred to Neurosurgery for consideration of surgical intervention. CTA neck and head was recommended but not completed. She refused MRI due to claustrophobia.  ? ?She was referred back to cardiology for rapid heart rate and dyspnea. She presents today.   ? ? ? ?Hypertension ?- lisinopril - causes dry cough and hoarseness ?- continue atenolol ? ? ?Chronic dyspnea on exertion ?Alpha 1 antitrypsin deficiency carrier ?- followed by pulmonology ? ? ? ? ? ? ? ? ? ?Past Medical History:  ?Diagnosis Date  ? AAT (alpha-1-antitrypsin) deficiency ([facility])   ? Depression   ? Dyspnea on exertion   ? Enlarged heart   ? Hypertension   ? Palpitations   ? Stroke [facility]) 08/09/2020  ? ? ?Past Surgical History:  ?Procedure Laterality Date  ? APPENDECTOMY    ? BREAST  ENHANCEMENT SURGERY    ? CESAREAN SECTION    ? CHOLECYSTECTOMY    ? COSMETIC SURGERY    ? ? ?Current Medications: ?No outpatient medications have been marked as taking for the 11/27/21 encounter (Appointment) with Marcelino Duster, PA.  ?  ? ?Allergies:   Codeine, Penicillins, and Sulfa drugs cross reactors  ? ?Social History  ? ?Socioeconomic History  ? Marital status: Divorced  ?  Spouse name: Not on file  ? Number of children: Not on file  ? Years of education: Not on file  ? Highest education level: Not on file  ?Occupational History  ?  Comment: full time 10/17/20  ?Tobacco Use  ? Smoking status: Former  ?  Packs/day: 0.25  ?  Types: Cigarettes  ?  Quit date: 11/19/2018  ?  Years since quitting: 2.9  ? Smokeless tobacco: Never  ? Tobacco comments:  ?  social smoking  ?Vaping Use  ? Vaping Use: Never used  ?Substance and Sexual Activity  ? Alcohol use: Yes  ?  Comment: occasionally  ? Drug use: No  ? Sexual activity: Not on file  ?Other Topics Concern  ? Not on file  ?Social History Narrative  ? Lives alone  ? Right Handed  ? Drinks no caffeine  ? ?Social Determinants of Health  ? ?Financial Resource Strain: Not on file  ?Food Insecurity: Not on file  ?Transportation Needs: Not on file  ?  Physical Activity: Not on file  ?Stress: Not on file  ?Social Connections: Not on file  ?  ? ?Family History: ?The patient's ***family history includes Alpha-1 antitrypsin deficiency in her mother; Breast cancer in her cousin, cousin, maternal aunt, maternal aunt, and paternal aunt; COPD in her brother, mother, and sister; Glaucoma in her father; Healthy in her brother and sister; Heart disease in her father and mother; Hypertension in her mother; Stroke in her mother. ? ?ROS:   ?Please see the history of present illness.    ?*** All other systems reviewed and are negative. ? ?EKGs/Labs/Other Studies Reviewed:   ? ?The following studies were reviewed today: ?*** ? ?EKG:  EKG is *** ordered today.  The ekg ordered today  demonstrates *** ? ?Recent Labs: ?No results found for requested labs within last 8760 hours.  ?Recent Lipid Panel ?   ?Component Value Date/Time  ? CHOL 221 (H) 10/17/2020 0905  ? TRIG 67 10/17/2020 0905  ? HDL 62 10/17/2020 0905  ? CHOLHDL 3.6 10/17/2020 0905  ? LDLCALC 147 (H) 10/17/2020 0905  ? ? ? ?Risk Assessment/Calculations:   ?{Does this patient have ATRIAL FIBRILLATION?:[phone]} ? ?    ? ?Physical Exam:   ? ?VS:  There were no vitals taken for this visit.   ? ?Wt Readings from Last 3 Encounters:  ?01/25/21 153 lb 6.4 oz (69.6 kg)  ?10/17/20 146 lb (66.2 kg)  ?10/12/20 147 lb (66.7 kg)  ?  ? ?GEN: *** Well nourished, well developed in no acute distress ?HEENT: Normal ?NECK: No JVD; No carotid bruits ?LYMPHATICS: No lymphadenopathy ?CARDIAC: ***RRR, no murmurs, rubs, gallops ?RESPIRATORY:  Clear to auscultation without rales, wheezing or rhonchi  ?ABDOMEN: Soft, non-tender, non-distended ?MUSCULOSKELETAL:  No edema; No deformity  ?SKIN: Warm and dry ?NEUROLOGIC:  Alert and oriented x 3 ?PSYCHIATRIC:  Normal affect  ? ?ASSESSMENT:   ? ?No diagnosis found. ?PLAN:   ? ?In order of problems listed above: ? ?*** ? ?   ? ?{Are you ordering a CV Procedure (e.g. stress test, cath, DCCV, TEE, etc)?   Press F2        :[id number]}  ? ? ?Medication Adjustments/Labs and Tests Ordered: ?Current medicines are reviewed at length with the patient today.  Concerns regarding medicines are outlined above.  ?No orders of the defined types were placed in this encounter. ? ?No orders of the defined types were placed in this encounter. ? ? ?There are no Patient Instructions on file for this visit.  ? ?Signed, ?Marcelino Duster, PA  ?11/14/2021 11:18 AM    ?[facility] ?

## 2021-11-15 DIAGNOSIS — R1013 Epigastric pain: Secondary | ICD-10-CM | POA: Diagnosis not present

## 2021-11-27 ENCOUNTER — Ambulatory Visit: Payer: BC Managed Care – PPO | Admitting: Physician Assistant

## 2021-12-25 DIAGNOSIS — E785 Hyperlipidemia, unspecified: Secondary | ICD-10-CM | POA: Diagnosis not present

## 2021-12-26 ENCOUNTER — Ambulatory Visit: Payer: BC Managed Care – PPO | Admitting: Physician Assistant

## 2021-12-28 DIAGNOSIS — R1013 Epigastric pain: Secondary | ICD-10-CM | POA: Diagnosis not present

## 2021-12-28 DIAGNOSIS — K2970 Gastritis, unspecified, without bleeding: Secondary | ICD-10-CM | POA: Diagnosis not present

## 2021-12-28 DIAGNOSIS — K259 Gastric ulcer, unspecified as acute or chronic, without hemorrhage or perforation: Secondary | ICD-10-CM | POA: Diagnosis not present

## 2021-12-28 DIAGNOSIS — K31A19 Gastric intestinal metaplasia without dysplasia, unspecified site: Secondary | ICD-10-CM | POA: Diagnosis not present

## 2021-12-28 DIAGNOSIS — K2940 Chronic atrophic gastritis without bleeding: Secondary | ICD-10-CM | POA: Diagnosis not present

## 2021-12-28 DIAGNOSIS — K2930 Chronic superficial gastritis without bleeding: Secondary | ICD-10-CM | POA: Diagnosis not present

## 2022-01-04 ENCOUNTER — Encounter: Payer: Self-pay | Admitting: Cardiovascular Disease

## 2022-01-04 ENCOUNTER — Ambulatory Visit (INDEPENDENT_AMBULATORY_CARE_PROVIDER_SITE_OTHER): Payer: BC Managed Care – PPO | Admitting: Cardiovascular Disease

## 2022-01-04 DIAGNOSIS — R0609 Other forms of dyspnea: Secondary | ICD-10-CM

## 2022-01-04 DIAGNOSIS — I10 Essential (primary) hypertension: Secondary | ICD-10-CM

## 2022-01-04 NOTE — Patient Instructions (Signed)

## 2022-01-04 NOTE — Progress Notes (Signed)
01/04/2022 [name]   [birth year]  [id number]  Primary Physician Farris Has, MD Primary Cardiologist: Runell Gess MD Nicholes Calamity, [place]  HPI:  [name] is a 61 y.o.   thin appearing Caucasian female mother of 3 children (2 biologic) and one grandson who was initially referred by Dr. Ricci Barker at [facility] for evaluation of new onset hypertension, dyspnea fatigue and palpitations.  I last saw her in the office 10/06/2019.  Her cardiac risk factor profile is notable for 12 years of tobacco abuse having quit 7 years ago. She does drink 2-4 beers a night. Her father had CAD. She said new-onset hypertension last several weeks with ER visit for headache. She had negative head CT and a chest CT that was negative for pulmonary emboli. Coronary calcification were not mentioned. She is complaining of fatigue the last year and increasing dyspnea on exertion. Recent 2-D echo showed normal LV function, mild LVH with grade 1 diastolic dysfunction.  A Myoview stress test was performed at the same time which was nonischemic.  Event monitor showed occasional PACs.   Since I saw her a year ago she has remained stable.  She has noticed increasing dyspnea on exertion and was recently diagnosed with alpha 1 antitrypsin deficiency.  She has been smoke-free for 3 years.  [place]  She had a normal 2D echo performed 10/20/2019 and a coronary calcium score of 0 performed 4/ 6/21.   Current Meds  Medication Sig   Artificial Saliva (BIOTENE ORALBALANCE DRY MOUTH MT) Use as directed in the mouth or throat 4 (four) times daily.   aspirin EC 81 MG tablet Take 1 tablet (81 mg total) by mouth daily. Swallow whole.   atenolol (TENORMIN) 50 MG tablet Take 50 mg by mouth daily.   atorvastatin (LIPITOR) 10 MG tablet Take 10 mg by mouth daily.   benzonatate (TESSALON) 100 MG capsule Take 1 capsule (100 mg total) by mouth every 8 (eight) hours as needed for cough.   diclofenac (VOLTAREN) 75 MG EC  tablet Take 75 mg by mouth 2 (two) times daily.   DULoxetine (CYMBALTA) 30 MG capsule Take 30 mg by mouth daily.   gabapentin (NEURONTIN) 300 MG capsule Take 300 mg by mouth daily.   hydroxychloroquine (PLAQUENIL) 200 MG tablet Take 400 mg by mouth daily. Pt states 400 mg once daily   leflunomide (ARAVA) 10 MG tablet Take 10 mg by mouth daily.   lisinopril (ZESTRIL) 10 MG tablet Take 10 mg by mouth daily.   methocarbamol (ROBAXIN) 750 MG tablet Take 750 mg by mouth in the morning and at bedtime.   omeprazole (PRILOSEC) 40 MG capsule Take 40 mg by mouth 2 (two) times daily.     Allergies  Allergen Reactions   Codeine Nausea And Vomiting   Penicillins Rash   Sulfa Drugs Cross Reactors Rash    Social History   Socioeconomic History   Marital status: Divorced    Spouse name: Not on file   Number of children: Not on file   Years of education: Not on file   Highest education level: Not on file  Occupational History    Comment: full time 10/17/20  Tobacco Use   Smoking status: Former    Packs/day: 0.25    Types: Cigarettes    Quit date: 11/19/2018    Years since quitting: 3.1   Smokeless tobacco: Never   Tobacco comments:    social smoking  Vaping Use  Vaping Use: Never used  Substance and Sexual Activity   Alcohol use: Yes    Comment: occasionally   Drug use: No   Sexual activity: Not on file  Other Topics Concern   Not on file  Social History Narrative   Lives alone   Right Handed   Drinks no caffeine   Social Determinants of Health   Financial Resource Strain: Not on file  Food Insecurity: Not on file  Transportation Needs: Not on file  Physical Activity: Not on file  Stress: Not on file  Social Connections: Not on file  Intimate Partner Violence: Not on file     Review of Systems: General: negative for chills, fever, night sweats or weight changes.  Cardiovascular: negative for chest pain, dyspnea on exertion, edema, orthopnea, palpitations, paroxysmal  nocturnal dyspnea or shortness of breath Dermatological: negative for rash Respiratory: negative for cough or wheezing Urologic: negative for hematuria Abdominal: negative for nausea, vomiting, diarrhea, bright red blood per rectum, melena, or hematemesis Neurologic: negative for visual changes, syncope, or dizziness All other systems reviewed and are otherwise negative except as noted above.    Blood pressure 128/84, pulse 66, height 5\' 5"  (1.651 m), weight 167 lb 12.8 oz (76.1 kg), SpO2 97 %.  General appearance: alert and no distress Neck: no adenopathy, no carotid bruit, no JVD, supple, symmetrical, trachea midline, and thyroid not enlarged, symmetric, no tenderness/mass/nodules Lungs: clear to auscultation bilaterally Heart: regular rate and rhythm, S1, S2 normal, no murmur, click, rub or gallop Extremities: extremities normal, atraumatic, no cyanosis or edema Pulses: 2+ and symmetric Skin: Skin color, texture, turgor normal. No rashes or lesions Neurologic: Grossly normal  EKG sinus rhythm at 66 without ST or T wave changes.  I personally reviewed this EKG.  ASSESSMENT AND PLAN:   Essential hypertension History of essential hypertension a blood pressure measured today at 128/84.  She is on atenolol and lisinopril.  Dyspnea on exertion History of dyspnea on exertion with essentially a normal 2D echo and a coronary calcium score of 0.  She was diagnosed with alpha-1 antitrypsin which I suspect is contributory as well as her long history tobacco abuse now tobacco free for 3 years.     Runell GessJonathan J. Madlynn Lundeen MD FACP,FACC,FAHA, [facility] 01/04/2022 2:15 PM

## 2022-01-04 NOTE — Assessment & Plan Note (Signed)
History of essential hypertension a blood pressure measured today at 128/84.  She is on atenolol and lisinopril.

## 2022-01-04 NOTE — Assessment & Plan Note (Signed)
History of dyspnea on exertion with essentially a normal 2D echo and a coronary calcium score of 0.  She was diagnosed with alpha-1 antitrypsin which I suspect is contributory as well as her long history tobacco abuse now tobacco free for 3 years.

## 2022-03-09 DIAGNOSIS — Z0000 Encounter for general adult medical examination without abnormal findings: Secondary | ICD-10-CM | POA: Diagnosis not present

## 2022-03-09 DIAGNOSIS — I10 Essential (primary) hypertension: Secondary | ICD-10-CM | POA: Diagnosis not present

## 2022-03-09 DIAGNOSIS — E559 Vitamin D deficiency, unspecified: Secondary | ICD-10-CM | POA: Diagnosis not present

## 2022-03-09 DIAGNOSIS — R1013 Epigastric pain: Secondary | ICD-10-CM | POA: Diagnosis not present

## 2022-03-09 DIAGNOSIS — Z23 Encounter for immunization: Secondary | ICD-10-CM | POA: Diagnosis not present

## 2022-03-09 DIAGNOSIS — M2550 Pain in unspecified joint: Secondary | ICD-10-CM | POA: Diagnosis not present

## 2022-03-15 DIAGNOSIS — Z23 Encounter for immunization: Secondary | ICD-10-CM | POA: Diagnosis not present

## 2022-03-15 DIAGNOSIS — M0609 Rheumatoid arthritis without rheumatoid factor, multiple sites: Secondary | ICD-10-CM | POA: Diagnosis not present

## 2022-03-15 DIAGNOSIS — Z79899 Other long term (current) drug therapy: Secondary | ICD-10-CM | POA: Diagnosis not present

## 2022-03-15 DIAGNOSIS — R5383 Other fatigue: Secondary | ICD-10-CM | POA: Diagnosis not present

## 2022-03-15 DIAGNOSIS — M797 Fibromyalgia: Secondary | ICD-10-CM | POA: Diagnosis not present

## 2022-03-15 DIAGNOSIS — M549 Dorsalgia, unspecified: Secondary | ICD-10-CM | POA: Diagnosis not present

## 2022-03-21 DIAGNOSIS — R945 Abnormal results of liver function studies: Secondary | ICD-10-CM | POA: Diagnosis not present

## 2022-03-21 DIAGNOSIS — R7989 Other specified abnormal findings of blood chemistry: Secondary | ICD-10-CM | POA: Diagnosis not present

## 2022-03-21 DIAGNOSIS — E039 Hypothyroidism, unspecified: Secondary | ICD-10-CM | POA: Diagnosis not present

## 2022-03-27 ENCOUNTER — Other Ambulatory Visit: Payer: Self-pay | Admitting: Family Medicine

## 2022-03-27 DIAGNOSIS — R7989 Other specified abnormal findings of blood chemistry: Secondary | ICD-10-CM

## 2022-03-30 ENCOUNTER — Ambulatory Visit
Admission: RE | Admit: 2022-03-30 | Discharge: 2022-03-30 | Disposition: A | Discharge status: Home / Self Care | Payer: BC Managed Care – PPO | Source: Ambulatory Visit | Attending: Family Medicine | Admitting: Family Medicine

## 2022-03-30 DIAGNOSIS — R7989 Other specified abnormal findings of blood chemistry: Secondary | ICD-10-CM

## 2022-03-30 DIAGNOSIS — Z79899 Other long term (current) drug therapy: Secondary | ICD-10-CM | POA: Diagnosis not present

## 2022-03-30 DIAGNOSIS — R945 Abnormal results of liver function studies: Secondary | ICD-10-CM | POA: Diagnosis not present

## 2022-03-30 DIAGNOSIS — Z9049 Acquired absence of other specified parts of digestive tract: Secondary | ICD-10-CM | POA: Diagnosis not present

## 2022-03-30 DIAGNOSIS — R140 Abdominal distension (gaseous): Secondary | ICD-10-CM | POA: Diagnosis not present

## 2022-04-18 ENCOUNTER — Other Ambulatory Visit: Payer: Self-pay | Admitting: Family Medicine

## 2022-04-18 DIAGNOSIS — K769 Liver disease, unspecified: Secondary | ICD-10-CM

## 2022-05-01 ENCOUNTER — Ambulatory Visit
Admission: RE | Admit: 2022-05-01 | Discharge: 2022-05-01 | Disposition: A | Discharge status: Home / Self Care | Payer: BC Managed Care – PPO | Source: Ambulatory Visit | Attending: Family Medicine | Admitting: Family Medicine

## 2022-05-01 DIAGNOSIS — K769 Liver disease, unspecified: Secondary | ICD-10-CM

## 2022-05-01 DIAGNOSIS — K7689 Other specified diseases of liver: Secondary | ICD-10-CM | POA: Diagnosis not present

## 2022-05-01 DIAGNOSIS — Z9049 Acquired absence of other specified parts of digestive tract: Secondary | ICD-10-CM | POA: Diagnosis not present

## 2022-05-01 DIAGNOSIS — K838 Other specified diseases of biliary tract: Secondary | ICD-10-CM | POA: Diagnosis not present

## 2022-05-01 MED ORDER — GADOBENATE DIMEGLUMINE 529 MG/ML IV SOLN
15.0000 mL | Freq: Once | INTRAVENOUS | Status: AC | PRN
  Administered 2022-05-01: 15 mL via INTRAVENOUS

## 2022-05-14 DIAGNOSIS — E039 Hypothyroidism, unspecified: Secondary | ICD-10-CM | POA: Diagnosis not present

## 2022-05-22 DIAGNOSIS — M79641 Pain in right hand: Secondary | ICD-10-CM | POA: Diagnosis not present

## 2022-05-22 DIAGNOSIS — M3501 Sicca syndrome with keratoconjunctivitis: Secondary | ICD-10-CM | POA: Diagnosis not present

## 2022-05-22 DIAGNOSIS — R5382 Chronic fatigue, unspecified: Secondary | ICD-10-CM | POA: Diagnosis not present

## 2022-05-22 DIAGNOSIS — R5381 Other malaise: Secondary | ICD-10-CM | POA: Diagnosis not present

## 2022-05-22 DIAGNOSIS — M545 Low back pain, unspecified: Secondary | ICD-10-CM | POA: Diagnosis not present

## 2022-05-22 DIAGNOSIS — G8929 Other chronic pain: Secondary | ICD-10-CM | POA: Diagnosis not present

## 2022-05-22 DIAGNOSIS — M2550 Pain in unspecified joint: Secondary | ICD-10-CM | POA: Diagnosis not present

## 2022-05-22 DIAGNOSIS — G479 Sleep disorder, unspecified: Secondary | ICD-10-CM | POA: Diagnosis not present

## 2022-05-22 DIAGNOSIS — M79642 Pain in left hand: Secondary | ICD-10-CM | POA: Diagnosis not present

## 2022-06-21 ENCOUNTER — Ambulatory Visit
Admission: RE | Admit: 2022-06-21 | Discharge: 2022-06-21 | Disposition: A | Discharge status: Home / Self Care | Payer: 59 | Source: Ambulatory Visit | Attending: Acute Care | Admitting: Acute Care

## 2022-06-21 DIAGNOSIS — Z87891 Personal history of nicotine dependence: Secondary | ICD-10-CM

## 2022-06-25 ENCOUNTER — Other Ambulatory Visit: Payer: Self-pay | Admitting: Acute Care

## 2022-06-25 DIAGNOSIS — Z87891 Personal history of nicotine dependence: Secondary | ICD-10-CM

## 2022-06-25 DIAGNOSIS — Z122 Encounter for screening for malignant neoplasm of respiratory organs: Secondary | ICD-10-CM

## 2022-07-26 DIAGNOSIS — Z23 Encounter for immunization: Secondary | ICD-10-CM | POA: Diagnosis not present

## 2022-07-26 DIAGNOSIS — M79643 Pain in unspecified hand: Secondary | ICD-10-CM | POA: Diagnosis not present

## 2022-07-26 DIAGNOSIS — M7989 Other specified soft tissue disorders: Secondary | ICD-10-CM | POA: Diagnosis not present

## 2022-07-26 DIAGNOSIS — M549 Dorsalgia, unspecified: Secondary | ICD-10-CM | POA: Diagnosis not present

## 2022-07-26 DIAGNOSIS — M797 Fibromyalgia: Secondary | ICD-10-CM | POA: Diagnosis not present

## 2022-07-26 DIAGNOSIS — M1990 Unspecified osteoarthritis, unspecified site: Secondary | ICD-10-CM | POA: Diagnosis not present

## 2022-07-26 DIAGNOSIS — M0579 Rheumatoid arthritis with rheumatoid factor of multiple sites without organ or systems involvement: Secondary | ICD-10-CM | POA: Diagnosis not present

## 2022-07-26 DIAGNOSIS — I7300 Raynaud's syndrome without gangrene: Secondary | ICD-10-CM | POA: Diagnosis not present

## 2022-07-26 DIAGNOSIS — R748 Abnormal levels of other serum enzymes: Secondary | ICD-10-CM | POA: Diagnosis not present

## 2022-07-26 DIAGNOSIS — M3505 Sjogren syndrome with inflammatory arthritis: Secondary | ICD-10-CM | POA: Diagnosis not present

## 2022-07-26 DIAGNOSIS — R5383 Other fatigue: Secondary | ICD-10-CM | POA: Diagnosis not present

## 2022-07-26 DIAGNOSIS — Z79899 Other long term (current) drug therapy: Secondary | ICD-10-CM | POA: Diagnosis not present

## 2022-08-24 DIAGNOSIS — R945 Abnormal results of liver function studies: Secondary | ICD-10-CM | POA: Diagnosis not present

## 2022-08-24 DIAGNOSIS — E559 Vitamin D deficiency, unspecified: Secondary | ICD-10-CM | POA: Diagnosis not present

## 2022-10-09 DIAGNOSIS — E039 Hypothyroidism, unspecified: Secondary | ICD-10-CM | POA: Diagnosis not present

## 2022-11-26 DIAGNOSIS — M1990 Unspecified osteoarthritis, unspecified site: Secondary | ICD-10-CM | POA: Diagnosis not present

## 2022-11-26 DIAGNOSIS — M2550 Pain in unspecified joint: Secondary | ICD-10-CM | POA: Diagnosis not present

## 2022-11-26 DIAGNOSIS — M3505 Sjogren syndrome with inflammatory arthritis: Secondary | ICD-10-CM | POA: Diagnosis not present

## 2022-11-26 DIAGNOSIS — M797 Fibromyalgia: Secondary | ICD-10-CM | POA: Diagnosis not present

## 2022-11-26 DIAGNOSIS — R5383 Other fatigue: Secondary | ICD-10-CM | POA: Diagnosis not present

## 2022-11-26 DIAGNOSIS — M0579 Rheumatoid arthritis with rheumatoid factor of multiple sites without organ or systems involvement: Secondary | ICD-10-CM | POA: Diagnosis not present

## 2022-11-26 DIAGNOSIS — I7300 Raynaud's syndrome without gangrene: Secondary | ICD-10-CM | POA: Diagnosis not present

## 2022-12-07 ENCOUNTER — Ambulatory Visit
Admission: EM | Admit: 2022-12-07 | Discharge: 2022-12-07 | Disposition: A | Discharge status: Home / Self Care | Payer: 59 | Attending: Family Medicine | Admitting: Family Medicine

## 2022-12-07 DIAGNOSIS — J441 Chronic obstructive pulmonary disease with (acute) exacerbation: Secondary | ICD-10-CM

## 2022-12-07 MED ORDER — PROMETHAZINE-DM 6.25-15 MG/5ML PO SYRP: 5.0000 mL | ORAL_SOLUTION | Freq: Four times a day (QID) | ORAL | 0 refills | Status: AC | PRN

## 2022-12-07 MED ORDER — PREDNISONE 20 MG PO TABS: 40.0000 mg | ORAL_TABLET | Freq: Every day | ORAL | 0 refills | Status: AC

## 2022-12-07 MED ORDER — DOXYCYCLINE HYCLATE 100 MG PO CAPS: 100.0000 mg | ORAL_CAPSULE | Freq: Two times a day (BID) | ORAL | 0 refills | Status: AC

## 2022-12-07 MED ORDER — ALBUTEROL SULFATE HFA 108 (90 BASE) MCG/ACT IN AERS: 2.0000 | INHALATION_SPRAY | RESPIRATORY_TRACT | 0 refills | Status: AC | PRN

## 2022-12-07 NOTE — Discharge Instructions (Signed)
Albuterol inhaler--do 2 puffs every 4 hours as needed for shortness of breath or wheezing  Take prednisone 20 mg--2 daily for 5 days  Take doxycycline 100 mg --1 capsule 2 times daily for 7 days  

## 2022-12-07 NOTE — ED Triage Notes (Signed)
Pt c/o productive cough with green sputum and chest congestion x9 days. States taking tessalon pearls and allergy meds with no relief.

## 2022-12-07 NOTE — ED Provider Notes (Addendum)
[name]    CSN: [id number] Arrival date & time: 12/07/22  1324      History   Chief Complaint Chief Complaint  Patient presents with   Cough    HPI [name] is a 62 y.o. female.    Cough  Here for cough and congestion has been going on for about 9 days.  She has not had much nasal drainage but she has had some wheezing and shortness of breath.  She does have a history of COPD  No fever at any point.  She is allergic to penicillin and sulfa.  They both cause a rash.  Codeine causes nausea and vomiting  She states she tried several treatments she was prescribed here about a year ago and the cough pills may have helped just a little bit.  Past Medical History:  Diagnosis Date   AAT (alpha-1-antitrypsin) deficiency (HCC)    Depression    Dyspnea on exertion    Enlarged heart    Hypertension    Palpitations    Stroke (HCC) 08/09/2020    Patient Active Problem List   Diagnosis Date Noted   Back pain 01/25/2021   Bilateral stenosis of lateral recess of lumbar spine 01/25/2021   Alpha-1-antitrypsin deficiency carrier 09/28/2020   Rheumatoid arthritis (HCC) 09/28/2020   Essential hypertension 09/29/2015   Dyspnea on exertion 09/29/2015   Palpitations 09/29/2015    Past Surgical History:  Procedure Laterality Date   APPENDECTOMY     BREAST ENHANCEMENT SURGERY     CESAREAN SECTION     CHOLECYSTECTOMY     COSMETIC SURGERY      OB History   No obstetric history on file.      Home Medications    Prior to Admission medications   Medication Sig Start Date End Date Taking? Authorizing Provider  albuterol (VENTOLIN HFA) 108 (90 Base) MCG/ACT inhaler Inhale 2 puffs into the lungs every 4 (four) hours as needed for wheezing or shortness of breath. 12/07/22  Yes Adley Mazurowski, Janace Aris, MD  doxycycline (VIBRAMYCIN) 100 MG capsule Take 1 capsule (100 mg total) by mouth 2 (two) times daily for 7 days. 12/07/22 12/14/22 Yes Sekai Nayak, Janace Aris, MD   predniSONE (DELTASONE) 20 MG tablet Take 2 tablets (40 mg total) by mouth daily with breakfast for 5 days. 12/07/22 12/12/22 Yes Zenia Resides, MD  promethazine-dextromethorphan (PROMETHAZINE-DM) 6.25-15 MG/5ML syrup Take 5 mLs by mouth 4 (four) times daily as needed for cough. 12/07/22  Yes Calder Oblinger, Janace Aris, MD  Artificial Saliva (BIOTENE ORALBALANCE DRY MOUTH MT) Use as directed in the mouth or throat 4 (four) times daily.    [provider]  aspirin EC 81 MG tablet Take 1 tablet (81 mg total) by mouth daily. Swallow whole. 08/10/20   Pricilla Loveless, MD  atenolol (TENORMIN) 50 MG tablet Take 50 mg by mouth daily.    [provider]  atorvastatin (LIPITOR) 10 MG tablet Take 10 mg by mouth daily. 12/18/21   [provider]  diclofenac (VOLTAREN) 75 MG EC tablet Take 75 mg by mouth 2 (two) times daily.    [provider]  DULoxetine (CYMBALTA) 30 MG capsule Take 30 mg by mouth daily.    [provider]  gabapentin (NEURONTIN) 300 MG capsule Take 300 mg by mouth daily. 07/21/20   [provider]  hydroxychloroquine (PLAQUENIL) 200 MG tablet Take 400 mg by mouth daily. Pt states 400 mg once daily 07/27/20   [provider]  leflunomide (ARAVA)  10 MG tablet Take 10 mg by mouth daily. 08/09/20   [provider]  lisinopril (ZESTRIL) 10 MG tablet Take 10 mg by mouth daily.    [provider]  methocarbamol (ROBAXIN) 750 MG tablet Take 750 mg by mouth in the morning and at bedtime.    [provider]  omeprazole (PRILOSEC) 40 MG capsule Take 40 mg by mouth 2 (two) times daily. 12/28/21   [provider]    Family History Family History  Problem Relation Age of Onset   Heart disease Mother    Stroke Mother    Hypertension Mother    COPD Mother        alpha 1 antitrypsin deficiency   Alpha-1 antitrypsin deficiency Mother        MZ   Heart disease Father    Glaucoma Father    COPD Sister    COPD  Brother    Healthy Brother    Healthy Sister    Breast cancer Maternal Aunt    Breast cancer Paternal Aunt    Breast cancer Cousin    Breast cancer Maternal Aunt    Breast cancer Cousin     Social History Social History   Tobacco Use   Smoking status: Former    Packs/day: .25    Types: Cigarettes    Quit date: 11/19/2018    Years since quitting: 4.0   Smokeless tobacco: Never   Tobacco comments:    social smoking  Vaping Use   Vaping Use: Never used  Substance Use Topics   Alcohol use: Yes    Comment: occasionally   Drug use: No     Allergies   Codeine, Penicillins, and Sulfa drugs cross reactors   Review of Systems Review of Systems  Respiratory:  Positive for cough.      Physical Exam Triage Vital Signs ED Triage Vitals [12/07/22 1416]  Enc Vitals Group     BP 126/83     Pulse Rate 79     Resp 18     Temp 97.8 F (36.6 C)     Temp Source Oral     SpO2 98 %     Weight      Height      Head Circumference      Peak Flow      Pain Score 0     Pain Loc      Pain Edu?      Excl. in GC?    No data found.  Updated Vital Signs BP 126/83 (BP Location: Left Arm)   Pulse 79   Temp 97.8 F (36.6 C) (Oral)   Resp 18   SpO2 98%   Visual Acuity Right Eye Distance:   Left Eye Distance:   Bilateral Distance:    Right Eye Near:   Left Eye Near:    Bilateral Near:     Physical Exam Vitals reviewed.  Constitutional:      General: She is not in acute distress.    Appearance: She is not toxic-appearing.  HENT:     Right Ear: Tympanic membrane and ear canal normal.     Left Ear: Tympanic membrane and ear canal normal.     Nose: Nose normal.     Mouth/Throat:     Mouth: Mucous membranes are moist.     Pharynx: No oropharyngeal exudate or posterior oropharyngeal erythema.  Eyes:     Extraocular Movements: Extraocular movements intact.     Conjunctiva/sclera: Conjunctivae normal.  Pupils: Pupils are equal, round, and reactive to light.   Cardiovascular:     Rate and Rhythm: Normal rate and regular rhythm.     Heart sounds: No murmur heard. Pulmonary:     Effort: No respiratory distress.     Breath sounds: No stridor. No wheezing, rhonchi or rales.     Comments: Air movement is good and breath sounds are easily audible.  Breath sounds are fairly coarse.  No wheezing at this time Chest:     Chest wall: No tenderness.  Musculoskeletal:     Cervical back: Neck supple.  Lymphadenopathy:     Cervical: No cervical adenopathy.  Skin:    Capillary Refill: Capillary refill takes less than 2 seconds.     Coloration: Skin is not jaundiced or pale.  Neurological:     General: No focal deficit present.     Mental Status: She is alert and oriented to person, place, and time.  Psychiatric:        Behavior: Behavior normal.      UC Treatments / Results  Labs (all labs ordered are listed, but only abnormal results are displayed) Labs Reviewed - No data to display  EKG   Radiology No results found.  Procedures Procedures (including critical care time)  Medications Ordered in UC Medications - No data to display  Initial Impression / Assessment and Plan / UC Course  I have reviewed the triage vital signs and the nursing notes.  Pertinent labs & imaging results that were available during my care of the patient were reviewed by me and considered in my medical decision making (see chart for details).         Treat for COPD exacerbation with prednisone and an albuterol inhaler.  Since she has been sick for 9 days with thickening congestion I am going to send in doxycycline.;  Also Phenergan with dextromethorphan is sent in for the cough. Final Clinical Impressions(s) / UC Diagnoses   Final diagnoses:  COPD exacerbation (HCC)     Discharge Instructions      Albuterol inhaler--do 2 puffs every 4 hours as needed for shortness of breath or wheezing  Take prednisone 20 mg--2 daily for 5 days  Take doxycycline  100 mg --1 capsule 2 times daily for 7 days      ED Prescriptions     Medication Sig Dispense Auth. Provider   albuterol (VENTOLIN HFA) 108 (90 Base) MCG/ACT inhaler Inhale 2 puffs into the lungs every 4 (four) hours as needed for wheezing or shortness of breath. 1 each Zenia Resides, MD   promethazine-dextromethorphan (PROMETHAZINE-DM) 6.25-15 MG/5ML syrup Take 5 mLs by mouth 4 (four) times daily as needed for cough. 118 mL Zenia Resides, MD   predniSONE (DELTASONE) 20 MG tablet Take 2 tablets (40 mg total) by mouth daily with breakfast for 5 days. 10 tablet Zenia Resides, MD   doxycycline (VIBRAMYCIN) 100 MG capsule Take 1 capsule (100 mg total) by mouth 2 (two) times daily for 7 days. 14 capsule Marlinda Mike, Janace Aris, MD      PDMP not reviewed this encounter.   Zenia Resides, MD 12/07/22 1430    Zenia Resides, MD 12/07/22 [phone]

## 2023-05-28 DIAGNOSIS — M0579 Rheumatoid arthritis with rheumatoid factor of multiple sites without organ or systems involvement: Secondary | ICD-10-CM | POA: Diagnosis not present

## 2023-05-28 DIAGNOSIS — M1990 Unspecified osteoarthritis, unspecified site: Secondary | ICD-10-CM | POA: Diagnosis not present

## 2023-05-28 DIAGNOSIS — I7300 Raynaud's syndrome without gangrene: Secondary | ICD-10-CM | POA: Diagnosis not present

## 2023-05-28 DIAGNOSIS — M2550 Pain in unspecified joint: Secondary | ICD-10-CM | POA: Diagnosis not present

## 2023-05-28 DIAGNOSIS — M3505 Sjogren syndrome with inflammatory arthritis: Secondary | ICD-10-CM | POA: Diagnosis not present

## 2023-05-28 DIAGNOSIS — R5383 Other fatigue: Secondary | ICD-10-CM | POA: Diagnosis not present

## 2023-05-28 DIAGNOSIS — M797 Fibromyalgia: Secondary | ICD-10-CM | POA: Diagnosis not present

## 2023-06-25 ENCOUNTER — Ambulatory Visit
Admission: RE | Admit: 2023-06-25 | Discharge: 2023-06-25 | Disposition: A | Discharge status: Home / Self Care | Payer: Medicare HMO | Source: Ambulatory Visit | Attending: Family Medicine | Admitting: Family Medicine

## 2023-06-25 DIAGNOSIS — Z122 Encounter for screening for malignant neoplasm of respiratory organs: Secondary | ICD-10-CM

## 2023-06-25 DIAGNOSIS — F17210 Nicotine dependence, cigarettes, uncomplicated: Secondary | ICD-10-CM | POA: Diagnosis not present

## 2023-06-25 DIAGNOSIS — Z87891 Personal history of nicotine dependence: Secondary | ICD-10-CM

## 2023-07-01 DIAGNOSIS — H524 Presbyopia: Secondary | ICD-10-CM | POA: Diagnosis not present

## 2023-07-01 DIAGNOSIS — H5213 Myopia, bilateral: Secondary | ICD-10-CM | POA: Diagnosis not present

## 2023-07-18 ENCOUNTER — Telehealth: Payer: Self-pay | Admitting: Primary Care

## 2023-07-18 NOTE — Telephone Encounter (Signed)
Patient would like her CT scan results posted to MyChart so that she can see them.

## 2023-07-22 ENCOUNTER — Other Ambulatory Visit: Payer: Self-pay

## 2023-07-22 DIAGNOSIS — Z87891 Personal history of nicotine dependence: Secondary | ICD-10-CM

## 2023-07-22 DIAGNOSIS — Z122 Encounter for screening for malignant neoplasm of respiratory organs: Secondary | ICD-10-CM

## 2023-07-22 NOTE — Telephone Encounter (Signed)
Pt has seen results in mychart nfn

## 2023-09-09 ENCOUNTER — Other Ambulatory Visit (HOSPITAL_COMMUNITY): Payer: Self-pay

## 2023-09-12 ENCOUNTER — Other Ambulatory Visit (HOSPITAL_COMMUNITY): Payer: Self-pay

## 2023-09-13 ENCOUNTER — Other Ambulatory Visit (HOSPITAL_BASED_OUTPATIENT_CLINIC_OR_DEPARTMENT_OTHER): Payer: Self-pay

## 2023-09-13 ENCOUNTER — Other Ambulatory Visit (HOSPITAL_COMMUNITY): Payer: Self-pay

## 2023-09-13 MED ORDER — BUPROPION HCL ER (XL) 150 MG PO TB24
150.0000 mg | ORAL_TABLET | Freq: Every morning | ORAL | 2 refills | Status: DC
  Filled 2023-09-18 – 2023-11-23 (×2): qty 90, 90d supply, fill #0

## 2023-09-13 MED ORDER — DULOXETINE HCL 30 MG PO CPEP
30.0000 mg | ORAL_CAPSULE | Freq: Two times a day (BID) | ORAL | 3 refills | Status: AC
Start: 2023-02-09 — End: ?
  Filled 2023-09-18 – 2023-11-23 (×3): qty 180, 90d supply, fill #0

## 2023-09-13 MED ORDER — ATENOLOL 50 MG PO TABS
50.0000 mg | ORAL_TABLET | Freq: Every day | ORAL | 3 refills | Status: DC
  Filled 2023-09-18 – 2023-11-23 (×3): qty 90, 90d supply, fill #0

## 2023-09-13 MED ORDER — OMEPRAZOLE 40 MG PO CPDR
40.0000 mg | DELAYED_RELEASE_CAPSULE | Freq: Two times a day (BID) | ORAL | 3 refills | Status: DC
  Filled 2023-09-18 – 2023-11-23 (×3): qty 180, 90d supply, fill #0

## 2023-09-13 MED ORDER — LEVOTHYROXINE SODIUM 25 MCG PO TABS
25.0000 ug | ORAL_TABLET | Freq: Every morning | ORAL | 3 refills | Status: DC
  Filled 2023-09-18 – 2023-11-23 (×3): qty 90, 90d supply, fill #0
  Filled 2024-02-16: qty 90, 90d supply, fill #1

## 2023-09-13 MED ORDER — LISINOPRIL 10 MG PO TABS
10.0000 mg | ORAL_TABLET | Freq: Every day | ORAL | 3 refills | Status: DC
  Filled 2023-09-18 – 2023-11-23 (×3): qty 90, 90d supply, fill #0

## 2023-09-13 MED ORDER — HYDROXYCHLOROQUINE SULFATE 200 MG PO TABS
400.0000 mg | ORAL_TABLET | Freq: Every day | ORAL | 3 refills | Status: AC
  Filled 2023-09-18 – 2023-11-23 (×3): qty 180, 90d supply, fill #0
  Filled 2024-02-16: qty 180, 90d supply, fill #1
  Filled 2024-05-18: qty 180, 90d supply, fill #2

## 2023-09-18 ENCOUNTER — Other Ambulatory Visit (HOSPITAL_COMMUNITY): Payer: Self-pay

## 2023-09-18 ENCOUNTER — Other Ambulatory Visit: Payer: Self-pay

## 2023-09-30 DIAGNOSIS — M3505 Sjogren syndrome with inflammatory arthritis: Secondary | ICD-10-CM | POA: Diagnosis not present

## 2023-09-30 DIAGNOSIS — Z79899 Other long term (current) drug therapy: Secondary | ICD-10-CM | POA: Diagnosis not present

## 2023-09-30 DIAGNOSIS — M0579 Rheumatoid arthritis with rheumatoid factor of multiple sites without organ or systems involvement: Secondary | ICD-10-CM | POA: Diagnosis not present

## 2023-10-21 DIAGNOSIS — E871 Hypo-osmolality and hyponatremia: Secondary | ICD-10-CM | POA: Diagnosis not present

## 2023-11-06 ENCOUNTER — Other Ambulatory Visit: Payer: Self-pay

## 2023-11-06 ENCOUNTER — Encounter: Payer: Self-pay | Admitting: Pharmacist

## 2023-11-06 ENCOUNTER — Other Ambulatory Visit (HOSPITAL_COMMUNITY): Payer: Self-pay

## 2023-11-11 ENCOUNTER — Other Ambulatory Visit: Payer: Self-pay

## 2023-11-23 ENCOUNTER — Other Ambulatory Visit (HOSPITAL_COMMUNITY): Payer: Self-pay

## 2023-11-23 ENCOUNTER — Other Ambulatory Visit (HOSPITAL_BASED_OUTPATIENT_CLINIC_OR_DEPARTMENT_OTHER): Payer: Self-pay

## 2023-11-25 ENCOUNTER — Other Ambulatory Visit (HOSPITAL_COMMUNITY): Payer: Self-pay

## 2024-01-09 ENCOUNTER — Other Ambulatory Visit (HOSPITAL_COMMUNITY): Payer: Self-pay

## 2024-01-09 ENCOUNTER — Other Ambulatory Visit: Payer: Self-pay

## 2024-01-09 MED ORDER — GABAPENTIN 400 MG PO CAPS
400.0000 mg | ORAL_CAPSULE | Freq: Every day | ORAL | 0 refills | Status: DC
  Filled 2024-01-09: qty 90, 90d supply, fill #0

## 2024-01-13 ENCOUNTER — Other Ambulatory Visit (HOSPITAL_COMMUNITY): Payer: Self-pay

## 2024-01-13 MED ORDER — LEVOCETIRIZINE DIHYDROCHLORIDE 5 MG PO TABS
5.0000 mg | ORAL_TABLET | Freq: Every evening | ORAL | 1 refills | Status: DC
  Filled 2024-01-13: qty 90, 90d supply, fill #0
  Filled 2024-04-09: qty 90, 90d supply, fill #1

## 2024-02-10 ENCOUNTER — Other Ambulatory Visit (HOSPITAL_COMMUNITY): Payer: Self-pay

## 2024-02-10 MED ORDER — METHOCARBAMOL 750 MG PO TABS
750.0000 mg | ORAL_TABLET | Freq: Two times a day (BID) | ORAL | 0 refills | Status: DC | PRN
  Filled 2024-02-10: qty 60, 30d supply, fill #0

## 2024-02-16 ENCOUNTER — Other Ambulatory Visit (HOSPITAL_COMMUNITY): Payer: Self-pay

## 2024-02-17 ENCOUNTER — Other Ambulatory Visit (HOSPITAL_COMMUNITY): Payer: Self-pay

## 2024-02-18 ENCOUNTER — Other Ambulatory Visit: Payer: Self-pay

## 2024-02-18 ENCOUNTER — Other Ambulatory Visit (HOSPITAL_COMMUNITY): Payer: Self-pay

## 2024-02-18 MED ORDER — LISINOPRIL 10 MG PO TABS
10.0000 mg | ORAL_TABLET | Freq: Every day | ORAL | 2 refills | Status: AC
  Filled 2024-02-18: qty 90, 90d supply, fill #0
  Filled 2024-05-18: qty 90, 90d supply, fill #1
  Filled 2024-09-11: qty 90, 90d supply, fill #2

## 2024-02-18 MED ORDER — ATENOLOL 50 MG PO TABS
50.0000 mg | ORAL_TABLET | Freq: Every day | ORAL | 2 refills | Status: AC
  Filled 2024-02-18: qty 90, 90d supply, fill #0
  Filled 2024-05-18: qty 90, 90d supply, fill #1
  Filled 2024-08-16: qty 90, 90d supply, fill #2

## 2024-02-18 MED ORDER — BUPROPION HCL ER (XL) 150 MG PO TB24
150.0000 mg | ORAL_TABLET | Freq: Every morning | ORAL | 2 refills | Status: AC
  Filled 2024-02-18: qty 90, 90d supply, fill #0
  Filled 2024-05-18: qty 90, 90d supply, fill #1
  Filled 2024-08-16: qty 90, 90d supply, fill #2

## 2024-02-18 MED ORDER — OMEPRAZOLE 40 MG PO CPDR
40.0000 mg | DELAYED_RELEASE_CAPSULE | Freq: Two times a day (BID) | ORAL | 2 refills | Status: AC
  Filled 2024-02-18: qty 180, 90d supply, fill #0
  Filled 2024-05-18: qty 180, 90d supply, fill #1
  Filled 2024-08-16: qty 180, 90d supply, fill #2

## 2024-02-28 ENCOUNTER — Other Ambulatory Visit (HOSPITAL_COMMUNITY): Payer: Self-pay

## 2024-03-16 ENCOUNTER — Other Ambulatory Visit (HOSPITAL_COMMUNITY): Payer: Self-pay

## 2024-03-26 ENCOUNTER — Other Ambulatory Visit (HOSPITAL_COMMUNITY): Payer: Self-pay

## 2024-03-26 ENCOUNTER — Encounter (HOSPITAL_COMMUNITY): Payer: Self-pay

## 2024-04-04 ENCOUNTER — Other Ambulatory Visit (HOSPITAL_COMMUNITY): Payer: Self-pay

## 2024-04-06 ENCOUNTER — Other Ambulatory Visit (HOSPITAL_COMMUNITY): Payer: Self-pay

## 2024-04-06 ENCOUNTER — Encounter (HOSPITAL_COMMUNITY): Payer: Self-pay | Admitting: Pharmacist

## 2024-04-09 ENCOUNTER — Other Ambulatory Visit: Payer: Self-pay

## 2024-04-09 ENCOUNTER — Other Ambulatory Visit (HOSPITAL_COMMUNITY): Payer: Self-pay

## 2024-04-10 ENCOUNTER — Other Ambulatory Visit (HOSPITAL_COMMUNITY): Payer: Self-pay

## 2024-04-10 ENCOUNTER — Other Ambulatory Visit: Payer: Self-pay

## 2024-04-10 MED ORDER — GABAPENTIN 400 MG PO CAPS
400.0000 mg | ORAL_CAPSULE | Freq: Every day | ORAL | 0 refills | Status: DC
  Filled 2024-04-10: qty 90, 90d supply, fill #0

## 2024-05-05 ENCOUNTER — Other Ambulatory Visit (HOSPITAL_COMMUNITY): Payer: Self-pay

## 2024-05-06 ENCOUNTER — Other Ambulatory Visit (HOSPITAL_COMMUNITY): Payer: Self-pay

## 2024-05-06 ENCOUNTER — Other Ambulatory Visit: Payer: Self-pay

## 2024-05-06 MED ORDER — METHOCARBAMOL 750 MG PO TABS
750.0000 mg | ORAL_TABLET | Freq: Two times a day (BID) | ORAL | 0 refills | Status: DC | PRN
  Filled 2024-05-06: qty 180, 90d supply, fill #0

## 2024-05-16 ENCOUNTER — Other Ambulatory Visit (HOSPITAL_COMMUNITY): Payer: Self-pay

## 2024-05-17 ENCOUNTER — Other Ambulatory Visit (HOSPITAL_COMMUNITY): Payer: Self-pay

## 2024-05-17 MED ORDER — LEVOTHYROXINE SODIUM 25 MCG PO TABS
25.0000 ug | ORAL_TABLET | Freq: Every morning | ORAL | 0 refills | Status: DC
  Filled 2024-05-17: qty 90, 90d supply, fill #0

## 2024-05-18 ENCOUNTER — Other Ambulatory Visit: Payer: Self-pay

## 2024-05-18 DIAGNOSIS — R5383 Other fatigue: Secondary | ICD-10-CM | POA: Diagnosis not present

## 2024-05-18 DIAGNOSIS — E559 Vitamin D deficiency, unspecified: Secondary | ICD-10-CM | POA: Diagnosis not present

## 2024-05-18 DIAGNOSIS — M2550 Pain in unspecified joint: Secondary | ICD-10-CM | POA: Diagnosis not present

## 2024-05-18 DIAGNOSIS — M069 Rheumatoid arthritis, unspecified: Secondary | ICD-10-CM | POA: Diagnosis not present

## 2024-05-18 DIAGNOSIS — M797 Fibromyalgia: Secondary | ICD-10-CM | POA: Diagnosis not present

## 2024-05-18 DIAGNOSIS — E785 Hyperlipidemia, unspecified: Secondary | ICD-10-CM | POA: Diagnosis not present

## 2024-05-18 DIAGNOSIS — E039 Hypothyroidism, unspecified: Secondary | ICD-10-CM | POA: Diagnosis not present

## 2024-05-18 DIAGNOSIS — M3500 Sicca syndrome, unspecified: Secondary | ICD-10-CM | POA: Diagnosis not present

## 2024-05-18 DIAGNOSIS — I10 Essential (primary) hypertension: Secondary | ICD-10-CM | POA: Diagnosis not present

## 2024-05-18 DIAGNOSIS — Z0000 Encounter for general adult medical examination without abnormal findings: Secondary | ICD-10-CM | POA: Diagnosis not present

## 2024-05-18 DIAGNOSIS — Z23 Encounter for immunization: Secondary | ICD-10-CM | POA: Diagnosis not present

## 2024-05-24 ENCOUNTER — Emergency Department (HOSPITAL_COMMUNITY): Admission: EM | Admit: 2024-05-24 | Discharge: 2024-05-24 | Disposition: A | Discharge status: Home / Self Care

## 2024-05-24 ENCOUNTER — Emergency Department (HOSPITAL_COMMUNITY)

## 2024-05-24 ENCOUNTER — Other Ambulatory Visit: Payer: Self-pay

## 2024-05-24 DIAGNOSIS — R030 Elevated blood-pressure reading, without diagnosis of hypertension: Secondary | ICD-10-CM | POA: Insufficient documentation

## 2024-05-24 DIAGNOSIS — Z7982 Long term (current) use of aspirin: Secondary | ICD-10-CM | POA: Diagnosis not present

## 2024-05-24 DIAGNOSIS — R519 Headache, unspecified: Secondary | ICD-10-CM | POA: Diagnosis not present

## 2024-05-24 DIAGNOSIS — D649 Anemia, unspecified: Secondary | ICD-10-CM | POA: Insufficient documentation

## 2024-05-24 DIAGNOSIS — I159 Secondary hypertension, unspecified: Secondary | ICD-10-CM

## 2024-05-24 LAB — CBC WITH DIFFERENTIAL/PLATELET
Abs Immature Granulocytes: 0.02 K/uL (ref 0.00–0.07)
Basophils Absolute: 0 K/uL (ref 0.0–0.1)
Basophils Relative: 1 %
Eosinophils Absolute: 0.1 K/uL (ref 0.0–0.5)
Eosinophils Relative: 2 %
HCT: 36.6 % (ref 36.0–46.0)
Hemoglobin: 11.9 g/dL — ABNORMAL LOW (ref 12.0–15.0)
Immature Granulocytes: 0 %
Lymphocytes Relative: 52 %
Lymphs Abs: 2.7 K/uL (ref 0.7–4.0)
MCH: 28.3 pg (ref 26.0–34.0)
MCHC: 32.5 g/dL (ref 30.0–36.0)
MCV: 87.1 fL (ref 80.0–100.0)
Monocytes Absolute: 0.5 K/uL (ref 0.1–1.0)
Monocytes Relative: 9 %
Neutro Abs: 1.8 K/uL (ref 1.7–7.7)
Neutrophils Relative %: 36 %
Platelets: 304 K/uL (ref 150–400)
RBC: 4.2 MIL/uL (ref 3.87–5.11)
RDW: 13.6 % (ref 11.5–15.5)
Smear Review: NORMAL
WBC: 5.1 K/uL (ref 4.0–10.5)
nRBC: 0 % (ref 0.0–0.2)

## 2024-05-24 LAB — COMPREHENSIVE METABOLIC PANEL WITH GFR
ALT: 13 U/L (ref 0–44)
AST: 19 U/L (ref 15–41)
Albumin: 3.6 g/dL (ref 3.5–5.0)
Alkaline Phosphatase: 97 U/L (ref 38–126)
Anion gap: 11 (ref 5–15)
BUN: <5 mg/dL — ABNORMAL LOW (ref 8–23)
CO2: 28 mmol/L (ref 22–32)
Calcium: 8.6 mg/dL — ABNORMAL LOW (ref 8.9–10.3)
Chloride: 98 mmol/L (ref 98–111)
Creatinine, Ser: 0.69 mg/dL (ref 0.44–1.00)
GFR, Estimated: >60 mL/min (ref >60)
Glucose, Bld: 123 mg/dL — ABNORMAL HIGH (ref 70–99)
Potassium: 3 mmol/L — ABNORMAL LOW (ref 3.5–5.1)
Sodium: 137 mmol/L (ref 135–145)
Total Bilirubin: 0.4 mg/dL (ref 0.0–1.2)
Total Protein: 7 g/dL (ref 6.5–8.1)

## 2024-05-24 MED ORDER — POTASSIUM CHLORIDE CRYS ER 20 MEQ PO TBCR
40.0000 meq | EXTENDED_RELEASE_TABLET | Freq: Once | ORAL | Status: AC
  Administered 2024-05-24: 40 meq via ORAL
  Filled 2024-05-24: qty 2

## 2024-05-24 NOTE — Discharge Instructions (Signed)
 Please follow-up with your primary doctor.  Return immediately for fevers, severe headache, vision loss, facial droop, unilateral weakness, slurred speech or difficulty finding words, chest pain, difficulty breathing or any new or worsening symptoms that are concerning to you.  As discussed keep a blood pressure log and follow-up with your primary doctor regarding medication adjustments.

## 2024-05-24 NOTE — ED Provider Notes (Signed)
 [place] EMERGENCY DEPARTMENT AT [facility] Provider Note   CSN: [id number] Arrival date & time: 05/24/24  9653     Patient presents with: No chief complaint on file.   [name] is a 63 y.o. female.   This is a 63 year old female present emergency department for evaluation of elevated blood pressure and mild headache.  Reports she has not felt well for the past few days after getting her immunizations at her primary doctor.  She has kept a lingering headache dull all over.  No vision loss facial droop stroke symptoms.  She noted that her blood pressure was in the 180s and this concerned her given that she had a headache.        Prior to Admission medications   Medication Sig Start Date End Date Taking? Authorizing Provider  albuterol  (VENTOLIN  HFA) 108 (90 Base) MCG/ACT inhaler Inhale 2 puffs into the lungs every 4 (four) hours as needed for wheezing or shortness of breath. 12/07/22   Banister, Sharlet POUR, MD  Artificial Saliva (BIOTENE ORALBALANCE DRY MOUTH MT) Use as directed in the mouth or throat 4 (four) times daily.    [provider]  aspirin  EC 81 MG tablet Take 1 tablet (81 mg total) by mouth daily. Swallow whole. 08/10/20   Freddi Hamilton, MD  atenolol  (TENORMIN ) 50 MG tablet Take 50 mg by mouth daily.    [provider]  atenolol  (TENORMIN ) 50 MG tablet Take 1 tablet (50 mg total) by mouth daily. 02/18/24     atorvastatin (LIPITOR) 10 MG tablet Take 10 mg by mouth daily. 12/18/21   [provider]  buPROPion  (WELLBUTRIN  XL) 150 MG 24 hr tablet Take 1 tablet (150 mg total) by mouth every morning. 02/18/24     diclofenac (VOLTAREN) 75 MG EC tablet Take 75 mg by mouth 2 (two) times daily.    [provider]  DULoxetine  (CYMBALTA ) 30 MG capsule Take 30 mg by mouth daily.    [provider]  DULoxetine  (CYMBALTA ) 30 MG capsule Take 1 capsule (30 mg total) by mouth 2 (two) times daily. 02/09/23     gabapentin  (NEURONTIN )  300 MG capsule Take 300 mg by mouth daily. 07/21/20   [provider]  gabapentin  (NEURONTIN ) 400 MG capsule Take 1 capsule (400 mg total) by mouth at bedtime. 04/10/24     hydroxychloroquine  (PLAQUENIL ) 200 MG tablet Take 400 mg by mouth daily. Pt states 400 mg once daily 07/27/20   [provider]  hydroxychloroquine  (PLAQUENIL ) 200 MG tablet Take 2 tablets (400 mg total) by mouth daily. 07/08/23     leflunomide (ARAVA) 10 MG tablet Take 10 mg by mouth daily. 08/09/20   [provider]  levocetirizine (XYZAL ) 5 MG tablet Take 1 tablet (5 mg total) by mouth every evening. 01/13/24     levothyroxine  (SYNTHROID ) 25 MCG tablet Take 1 tablet (25 mcg total) by mouth in the morning  on an empty stomach. 05/16/24     lisinopril  (ZESTRIL ) 10 MG tablet Take 10 mg by mouth daily.    [provider]  lisinopril  (ZESTRIL ) 10 MG tablet Take 1 tablet (10 mg total) by mouth daily. 02/18/24     methocarbamol  (ROBAXIN ) 750 MG tablet Take 750 mg by mouth in the morning and at bedtime.    [provider]  methocarbamol  (ROBAXIN ) 750 MG tablet Take 1 tablet (750 mg total) by mouth 2 (two) times daily as needed. 05/06/24     omeprazole  (PRILOSEC) 40 MG  capsule Take 40 mg by mouth 2 (two) times daily. 12/28/21   [provider]  omeprazole  (PRILOSEC) 40 MG capsule Take 1 capsule (40 mg total) by mouth 30 minutes before morning and evening meal. 02/18/24     promethazine -dextromethorphan (PROMETHAZINE -DM) 6.25-15 MG/5ML syrup Take 5 mLs by mouth 4 (four) times daily as needed for cough. 12/07/22   [name], MD    Allergies: Codeine, Penicillins, and Sulfa drugs cross reactors    Review of Systems  Updated Vital Signs BP (!) 130/117 (BP Location: Right Arm)   Pulse 64   Temp (!) 97.5 F (36.4 C) (Oral)   Resp 15   SpO2 100%   Physical Exam Vitals and nursing note reviewed.  Constitutional:      General: She is not in acute distress.    Appearance: She is  not toxic-appearing.  HENT:     Head: Normocephalic and atraumatic.     Nose: Nose normal.     Mouth/Throat:     Mouth: Mucous membranes are moist.  Eyes:     Conjunctiva/sclera: Conjunctivae normal.  Cardiovascular:     Rate and Rhythm: Normal rate and regular rhythm.  Pulmonary:     Effort: Pulmonary effort is normal.     Breath sounds: Normal breath sounds.  Abdominal:     General: Abdomen is flat. There is no distension.     Palpations: Abdomen is soft.     Tenderness: There is no abdominal tenderness. There is no guarding or rebound.  Musculoskeletal:        General: Normal range of motion.  Skin:    General: Skin is warm and dry.     Capillary Refill: Capillary refill takes less than 2 seconds.  Neurological:     General: No focal deficit present.     Mental Status: She is alert and oriented to person, place, and time.  Psychiatric:        Mood and Affect: Mood normal.        Behavior: Behavior normal.     (all labs ordered are listed, but only abnormal results are displayed) Labs Reviewed  CBC WITH DIFFERENTIAL/PLATELET - Abnormal; Notable for the following components:      Result Value   Hemoglobin 11.9 (*)    All other components within normal limits  COMPREHENSIVE METABOLIC PANEL WITH GFR - Abnormal; Notable for the following components:   Potassium 3.0 (*)    Glucose, Bld 123 (*)    BUN <5 (*)    Calcium  8.6 (*)    All other components within normal limits    EKG: None  Radiology: CT Head Wo Contrast Result Date: 05/24/2024 EXAM: CT HEAD WITHOUT CONTRAST 05/24/2024 07:27:13 AM TECHNIQUE: CT of the head was performed without the administration of intravenous contrast. Automated exposure control, iterative reconstruction, and/or weight based adjustment of the mA/kV was utilized to reduce the radiation dose to as low as reasonably achievable. COMPARISON: 08/09/2020 CLINICAL HISTORY: Headache, new onset (Age >= 51y). Table formatting from the original note  was not included. Patient reports elevated BP= 182/103 at home this evening with mild headache. FINDINGS: BRAIN AND VENTRICLES: No acute hemorrhage. No evidence of acute infarct. No hydrocephalus. No extra-axial collection. No mass effect or midline shift. ORBITS: No acute abnormality. SINUSES: No acute abnormality. SOFT TISSUES AND SKULL: No acute soft tissue abnormality. No skull fracture. IMPRESSION: 1. No acute intracranial abnormality. Electronically signed by: Waddell Calk MD 05/24/2024 07:36 AM EDT RP Workstation: [id number]  Procedures   Medications Ordered in the ED  potassium chloride SA (KLOR-CON M) CR tablet 40 mEq (40 mEq Oral Given 05/24/24 0723)                                    Medical Decision Making Is a well-appearing 63 year old female presenting emergency department for evaluation of elevated blood pressure.  She is otherwise asymptomatic.  Initial blood pressure 187/99 improved to 130/117 without intervention.  She does have a mild headache, but no neurodeficits.  Moving neck freely without fever or tachycardia to suggest meningitis.  CT head negative for acute pathology.  Labs without kidney injury.  Did have mildly low potassium repleted.  Mild anemia noted.  Discussed [facility] current guidelines on management of asymptomatic hypertension in the emergency department and the need for close PCP follow-up.  Patient states that she will keep a blood pressure journal and follow-up with her primary doctor.  She is given strict return precautions.  Will discharge in stable condition.  Amount and/or Complexity of Data Reviewed External Data Reviewed:     Details: Is on atenolol , Synthroid , and lisinopril  Labs: ordered.    Details: See above Radiology: ordered and independent interpretation performed.    Details: Do not appreciate obvious intracranial pathology on her CT head  Risk Prescription drug management. Decision regarding hospitalization. Diagnosis or treatment  significantly limited by social determinants of health. Risk Details: Poor health literacy       Final diagnoses:  None    ED Discharge Orders     None          Neysa Caron PARAS, DO 05/24/24 [phone]

## 2024-05-24 NOTE — ED Triage Notes (Signed)
 Patient reports elevated BP= 182/103 at home this evening with mild headache .

## 2024-06-03 DIAGNOSIS — I10 Essential (primary) hypertension: Secondary | ICD-10-CM | POA: Diagnosis not present

## 2024-06-03 DIAGNOSIS — F411 Generalized anxiety disorder: Secondary | ICD-10-CM | POA: Diagnosis not present

## 2024-06-03 DIAGNOSIS — E785 Hyperlipidemia, unspecified: Secondary | ICD-10-CM | POA: Diagnosis not present

## 2024-06-17 ENCOUNTER — Other Ambulatory Visit (HOSPITAL_COMMUNITY): Payer: Self-pay

## 2024-06-17 ENCOUNTER — Other Ambulatory Visit: Payer: Self-pay

## 2024-06-17 MED ORDER — ROSUVASTATIN CALCIUM 5 MG PO TABS
5.0000 mg | ORAL_TABLET | Freq: Every day | ORAL | 3 refills | Status: AC
  Filled 2024-06-17: qty 90, 90d supply, fill #0
  Filled 2024-09-11: qty 90, 90d supply, fill #1
  Filled 2024-09-14: qty 90, 90d supply, fill #2

## 2024-06-17 MED ORDER — LISINOPRIL 20 MG PO TABS
20.0000 mg | ORAL_TABLET | Freq: Every day | ORAL | 3 refills | Status: AC
  Filled 2024-06-17: qty 90, 90d supply, fill #0
  Filled 2024-09-11: qty 90, 90d supply, fill #1

## 2024-07-06 ENCOUNTER — Other Ambulatory Visit: Payer: Self-pay

## 2024-07-06 ENCOUNTER — Other Ambulatory Visit (HOSPITAL_COMMUNITY): Payer: Self-pay

## 2024-07-06 DIAGNOSIS — E785 Hyperlipidemia, unspecified: Secondary | ICD-10-CM | POA: Diagnosis not present

## 2024-07-06 DIAGNOSIS — I10 Essential (primary) hypertension: Secondary | ICD-10-CM | POA: Diagnosis not present

## 2024-07-06 MED ORDER — GABAPENTIN 400 MG PO CAPS
400.0000 mg | ORAL_CAPSULE | Freq: Every day | ORAL | 0 refills | Status: AC
  Filled 2024-07-06: qty 90, 90d supply, fill #0

## 2024-07-06 MED ORDER — LEVOCETIRIZINE DIHYDROCHLORIDE 5 MG PO TABS
5.0000 mg | ORAL_TABLET | Freq: Every evening | ORAL | 3 refills | Status: AC
  Filled 2024-07-06: qty 90, 90d supply, fill #0

## 2024-07-20 ENCOUNTER — Inpatient Hospital Stay
Admission: RE | Admit: 2024-07-20 | Discharge: 2024-07-20 | Disposition: A | Source: Ambulatory Visit | Attending: Acute Care | Admitting: Acute Care

## 2024-07-20 DIAGNOSIS — Z122 Encounter for screening for malignant neoplasm of respiratory organs: Secondary | ICD-10-CM

## 2024-07-20 DIAGNOSIS — Z87891 Personal history of nicotine dependence: Secondary | ICD-10-CM

## 2024-07-28 ENCOUNTER — Other Ambulatory Visit: Payer: Self-pay

## 2024-07-28 DIAGNOSIS — Z122 Encounter for screening for malignant neoplasm of respiratory organs: Secondary | ICD-10-CM

## 2024-07-28 DIAGNOSIS — Z87891 Personal history of nicotine dependence: Secondary | ICD-10-CM

## 2024-08-03 ENCOUNTER — Other Ambulatory Visit (HOSPITAL_COMMUNITY): Payer: Self-pay

## 2024-08-03 MED ORDER — METHOCARBAMOL 750 MG PO TABS
750.0000 mg | ORAL_TABLET | Freq: Two times a day (BID) | ORAL | 0 refills | Status: AC | PRN
  Filled 2024-08-03: qty 180, 90d supply, fill #0

## 2024-08-16 ENCOUNTER — Other Ambulatory Visit (HOSPITAL_COMMUNITY): Payer: Self-pay

## 2024-08-17 ENCOUNTER — Other Ambulatory Visit (HOSPITAL_COMMUNITY): Payer: Self-pay

## 2024-08-17 ENCOUNTER — Other Ambulatory Visit: Payer: Self-pay

## 2024-08-17 MED ORDER — LEVOTHYROXINE SODIUM 25 MCG PO TABS
25.0000 ug | ORAL_TABLET | Freq: Every day | ORAL | 3 refills | Status: AC
  Filled 2024-08-17: qty 90, 90d supply, fill #0
  Filled 2024-09-14: qty 30, 30d supply, fill #1

## 2024-08-18 ENCOUNTER — Other Ambulatory Visit: Payer: Self-pay

## 2024-08-19 ENCOUNTER — Other Ambulatory Visit (HOSPITAL_COMMUNITY): Payer: Self-pay

## 2024-09-11 ENCOUNTER — Other Ambulatory Visit: Payer: Self-pay

## 2024-09-14 ENCOUNTER — Other Ambulatory Visit: Payer: Self-pay

## 2024-09-14 ENCOUNTER — Other Ambulatory Visit (HOSPITAL_COMMUNITY): Payer: Self-pay
